# Patient Record
Sex: Female | Born: 1975 | Race: Black or African American | Hispanic: No | Marital: Single | Smoking: Never smoker
Health system: Southern US, Community
[De-identification: ages and names within clinical notes are randomized; demographics above are authoritative.]

## PROBLEM LIST (undated history)

## (undated) DIAGNOSIS — K219 Gastro-esophageal reflux disease without esophagitis: Secondary | ICD-10-CM

---

## 2000-06-03 ENCOUNTER — Emergency Department (HOSPITAL_COMMUNITY): Admission: EM | Admit: 2000-06-03 | Discharge: 2000-06-03 | Payer: Self-pay | Admitting: Emergency Medicine

## 2001-12-26 ENCOUNTER — Other Ambulatory Visit: Admission: RE | Admit: 2001-12-26 | Discharge: 2001-12-26 | Payer: Self-pay | Admitting: Obstetrics and Gynecology

## 2003-04-04 ENCOUNTER — Other Ambulatory Visit: Admission: RE | Admit: 2003-04-04 | Discharge: 2003-04-04 | Payer: Self-pay | Admitting: Obstetrics and Gynecology

## 2007-07-26 ENCOUNTER — Emergency Department (HOSPITAL_COMMUNITY): Admission: EM | Admit: 2007-07-26 | Discharge: 2007-07-26 | Payer: Self-pay | Admitting: Emergency Medicine

## 2007-09-09 ENCOUNTER — Inpatient Hospital Stay (HOSPITAL_COMMUNITY): Admission: AD | Admit: 2007-09-09 | Discharge: 2007-09-09 | Payer: Self-pay | Admitting: Gynecology

## 2011-06-23 LAB — CULTURE, ROUTINE-ABSCESS

## 2011-09-28 ENCOUNTER — Encounter (HOSPITAL_COMMUNITY): Payer: Self-pay | Admitting: Emergency Medicine

## 2011-09-28 ENCOUNTER — Emergency Department (HOSPITAL_COMMUNITY)
Admission: EM | Admit: 2011-09-28 | Discharge: 2011-09-28 | Disposition: A | Discharge status: Home / Self Care | Payer: Self-pay | Attending: Emergency Medicine | Admitting: Emergency Medicine

## 2011-09-28 DIAGNOSIS — M549 Dorsalgia, unspecified: Secondary | ICD-10-CM | POA: Insufficient documentation

## 2011-09-28 DIAGNOSIS — N390 Urinary tract infection, site not specified: Secondary | ICD-10-CM | POA: Insufficient documentation

## 2011-09-28 DIAGNOSIS — R109 Unspecified abdominal pain: Secondary | ICD-10-CM | POA: Insufficient documentation

## 2011-09-28 LAB — URINALYSIS, ROUTINE W REFLEX MICROSCOPIC
Protein, ur: NEGATIVE mg/dL
Urobilinogen, UA: 1 mg/dL (ref 0.0–1.0)

## 2011-09-28 LAB — URINE MICROSCOPIC-ADD ON

## 2011-09-28 MED ORDER — CIPROFLOXACIN HCL 500 MG PO TABS: 500.0000 mg | ORAL_TABLET | Freq: Two times a day (BID) | ORAL | Status: AC

## 2011-09-28 NOTE — ED Provider Notes (Signed)
History     CSN: [id number]  Arrival date & time 09/28/11  1745   First MD Initiated Contact with Patient 09/28/11 1830      Chief Complaint  Patient presents with  . Back Pain    (Consider location/radiation/quality/duration/timing/severity/associated sxs/prior treatment) Patient is a 36 y.o. female presenting with back pain. The history is provided by the patient. No language interpreter was used.  Back Pain  This is a recurrent problem. The current episode started 12 to 24 hours ago. Episode frequency: intermittant L flank pain. The problem has not changed since onset.The pain is associated with no known injury. The quality of the pain is described as stabbing and aching. The pain does not radiate. The pain is at a severity of 10/10. Pertinent negatives include no chest pain, no fever, no abdominal pain, no dysuria, no pelvic pain, no paresthesias and no paresis.  Reports intermittant right flank pain that is severe and lasts up to an hour at a time. States that she has to pace back and forth or ball up when the pain comes on. 4 episodes in the last 24 hours.   Denies hematuria or pmh of kidney stone. Pain free now. Does not want CT scan because she has no insurance.    History reviewed. No pertinent past medical history.  History reviewed. No pertinent past surgical history.  History reviewed. No pertinent family history.  History  Substance Use Topics  . Smoking status: Never Smoker   . Smokeless tobacco: Not on file  . Alcohol Use: Yes    OB History    Grav Para Term Preterm Abortions TAB SAB Ect Mult Living                  Review of Systems  Constitutional: Negative for fever.  Cardiovascular: Negative for chest pain.  Gastrointestinal: Negative for abdominal pain.  Genitourinary: Positive for flank pain. Negative for dysuria and pelvic pain.  Musculoskeletal: Positive for back pain.  Neurological: Negative for paresthesias.  All other systems reviewed and are  negative.    Allergies  Review of patient's allergies indicates no known allergies.  Home Medications   Current Outpatient Rx  Name Route Sig Dispense Refill  . IBUPROFEN 200 MG PO TABS Oral Take 200 mg by mouth every 6 (six) hours as needed. For pain    . SIMETHICONE 80 MG PO CHEW Oral Chew 80 mg by mouth every 6 (six) hours as needed. For gas relief      BP 134/84  Pulse 79  Temp(Src) 98.4 F (36.9 C) (Oral)  Resp 18  SpO2 100%  LMP 09/14/2011  Physical Exam  Nursing note and vitals reviewed. Constitutional: She is oriented to person, place, and time. She appears well-developed and well-nourished.  HENT:  Head: Normocephalic.  Eyes: Pupils are equal, round, and reactive to light.  Neck: Normal range of motion.  Cardiovascular: Normal rate.   Pulmonary/Chest: Effort normal.  Abdominal: Soft. She exhibits no distension. There is no tenderness. There is no rebound and no guarding.  Musculoskeletal: Normal range of motion.  Neurological: She is alert and oriented to person, place, and time.  Skin: Skin is warm and dry.  Psychiatric: She has a normal mood and affect.    ED Course  Procedures (including critical care time)  Labs Reviewed - No data to display No results found.   No diagnosis found.    MDM  Intermittant R flank pain x 24 hours lasting about an hour at  a time. Pain free in ER.  Refused CT to r/o kidney stone.  Will strain her urine. And return if worse.  Treated for UTI with blood and wbc in her urine.  Suspect she may have passed a kidney stone although there is no pmh.   Labs Reviewed  URINALYSIS, ROUTINE W REFLEX MICROSCOPIC - Abnormal; Notable for the following:    APPearance CLOUDY (*)    Hgb urine dipstick TRACE (*)    Ketones, ur TRACE (*)    Leukocytes, UA MODERATE (*)    All other components within normal limits  URINE MICROSCOPIC-ADD ON - Abnormal; Notable for the following:    Squamous Epithelial / LPF MANY (*)    Bacteria, UA FEW  (*)    All other components within normal limits  LAB REPORT - SCANNED  URINE CULTURE          Jethro Bastos, NP 09/29/11 2211

## 2011-09-30 NOTE — ED Provider Notes (Signed)
Medical screening examination/treatment/procedure(s) were performed by non-physician practitioner and as supervising physician I was immediately available for consultation/collaboration.  Samanth Mirkin, MD 09/30/11 1406 

## 2015-02-02 ENCOUNTER — Emergency Department (HOSPITAL_COMMUNITY)
Admission: EM | Admit: 2015-02-02 | Discharge: 2015-02-02 | Disposition: A | Discharge status: Home / Self Care | Payer: BLUE CROSS/BLUE SHIELD | Attending: Emergency Medicine | Admitting: Emergency Medicine

## 2015-02-02 ENCOUNTER — Other Ambulatory Visit: Payer: Self-pay

## 2015-02-02 ENCOUNTER — Emergency Department (HOSPITAL_COMMUNITY): Payer: BLUE CROSS/BLUE SHIELD

## 2015-02-02 ENCOUNTER — Encounter (HOSPITAL_COMMUNITY): Payer: Self-pay | Admitting: Emergency Medicine

## 2015-02-02 DIAGNOSIS — K219 Gastro-esophageal reflux disease without esophagitis: Secondary | ICD-10-CM

## 2015-02-02 DIAGNOSIS — M546 Pain in thoracic spine: Secondary | ICD-10-CM | POA: Insufficient documentation

## 2015-02-02 DIAGNOSIS — Z791 Long term (current) use of non-steroidal anti-inflammatories (NSAID): Secondary | ICD-10-CM | POA: Insufficient documentation

## 2015-02-02 DIAGNOSIS — Z79899 Other long term (current) drug therapy: Secondary | ICD-10-CM | POA: Insufficient documentation

## 2015-02-02 LAB — I-STAT TROPONIN, ED: Troponin i, poc: 0 ng/mL (ref 0.00–0.08)

## 2015-02-02 MED ORDER — GI COCKTAIL ~~LOC~~
30.0000 mL | Freq: Once | ORAL | Status: AC
  Administered 2015-02-02: 30 mL via ORAL
  Filled 2015-02-02: qty 30

## 2015-02-02 MED ORDER — OMEPRAZOLE 20 MG PO CPDR: 20.0000 mg | DELAYED_RELEASE_CAPSULE | Freq: Every day | ORAL | Status: DC

## 2015-02-02 NOTE — ED Notes (Signed)
Pt ambulating independently w/ steady gait on d/c in no acute distress, A&Ox4. D/c instructions reviewed w/ pt and family - pt and family deny any further questions or concerns at present. Rx given x1  

## 2015-02-02 NOTE — ED Provider Notes (Signed)
CSN: [id number]     Arrival date & time 02/02/15  4098 History   First MD Initiated Contact with Patient 02/02/15 0221     Chief Complaint  Patient presents with  . Chest Pain  . Back Pain     (Consider location/radiation/quality/duration/timing/severity/associated sxs/prior Treatment) Patient is a 39 y.o. female presenting with back pain. The history is provided by the patient. No language interpreter was used.  Back Pain Worsened by:  Lying down Associated symptoms: no abdominal pain and no fever   Associated symptoms comment:  She complains of sharp pain in the upper back for the past 6 days. No cough, SOB, fever. She reports vomiting she feels is related to when the pain is at its worst. There is no pain during the day, only at night when she lays down to go to bed. She has tried taking GasEx without relief.    History reviewed. No pertinent past medical history. History reviewed. No pertinent past surgical history. No family history on file. History  Substance Use Topics  . Smoking status: Never Smoker   . Smokeless tobacco: Not on file  . Alcohol Use: Yes   OB History    No data available     Review of Systems  Constitutional: Negative for fever and chills.  HENT: Negative.   Respiratory: Negative.  Negative for cough and shortness of breath.   Gastrointestinal: Positive for vomiting. Negative for abdominal pain.  Musculoskeletal: Positive for back pain.  Skin: Negative.   Neurological: Negative.       Allergies  Review of patient's allergies indicates no known allergies.  Home Medications   Prior to Admission medications   Medication Sig Start Date End Date Taking? Authorizing Provider  diphenhydrAMINE (BENADRYL) 25 mg capsule Take 25 mg by mouth once.   Yes Historical Provider, MD  naproxen sodium (ANAPROX) 220 MG tablet Take 220 mg by mouth 2 (two) times daily with a meal.   Yes Historical Provider, MD  simethicone (MYLICON) 80 MG chewable tablet Chew 80  mg by mouth every 6 (six) hours as needed. For gas relief   Yes Historical Provider, MD  ibuprofen (ADVIL,MOTRIN) 200 MG tablet Take 200 mg by mouth every 6 (six) hours as needed. For pain    Historical Provider, MD   BP 158/88 mmHg  Pulse 86  Temp(Src) 98.5 F (36.9 C) (Oral)  Resp 18  Ht  (1.6 m)  Wt 155 lb (70.308 kg)  BMI 27.46 kg/m2  SpO2 100%  LMP 01/23/2015 Physical Exam  Constitutional: She is oriented to person, place, and time. She appears well-developed and well-nourished.  HENT:  Head: Normocephalic.  Neck: Normal range of motion. Neck supple.  Cardiovascular: Normal rate and regular rhythm.   Pulmonary/Chest: Effort normal and breath sounds normal. She has no wheezes. She has no rales.  Abdominal: Soft. Bowel sounds are normal. There is no tenderness. There is no rebound and no guarding.  Musculoskeletal: Normal range of motion.  Neurological: She is alert and oriented to person, place, and time.  Skin: Skin is warm and dry. No rash noted.  Psychiatric: She has a normal mood and affect.    ED Course  Procedures (including critical care time) Labs Review Labs Reviewed  Rosezena Sensor, ED    Imaging Review Dg Chest Port 1 View  02/02/2015   CLINICAL DATA:  Acute onset of central pain and pressure, radiating to the back. Vomiting. Initial encounter.  EXAM: PORTABLE CHEST - 1 VIEW  COMPARISON:  None.  FINDINGS: The lungs are well-aerated and clear. There is no evidence of focal opacification, pleural effusion or pneumothorax.  The cardiomediastinal silhouette is within normal limits. No acute osseous abnormalities are seen.  IMPRESSION: No acute cardiopulmonary process seen.   Electronically Signed   By: Roanna RaiderJeffery  Chang M.D.   On: 02/02/2015 01:28     EKG Interpretation None      MDM   Final diagnoses:  None    1. GERD  Pain is improved with GI cocktail. Pain worse when lying down - c/w symptoms of reflux esophagitis. Negative CXR, nonacute EKG and  negative troponin reassuring as well.     Elpidio AnisShari Merced Brougham, PA-C 02/02/15 2234  Tomasita CrumbleAdeleke Oni, MD 02/03/15 [phone]

## 2015-02-02 NOTE — ED Notes (Signed)
Pt from  Home c/o central chest pain/pressure that radiates through to her back. Pain is worse when laying down.  Vomiting present when pain comes.

## 2015-02-02 NOTE — Discharge Instructions (Signed)
Gastroesophageal Reflux Disease, Adult  Gastroesophageal reflux disease (GERD) happens when acid from your stomach flows up into the esophagus. When acid comes in contact with the esophagus, the acid causes soreness (inflammation) in the esophagus. Over time, GERD may create small holes (ulcers) in the lining of the esophagus.  CAUSES   · Increased body weight. This puts pressure on the stomach, making acid rise from the stomach into the esophagus.  · Smoking. This increases acid production in the stomach.  · Drinking alcohol. This causes decreased pressure in the lower esophageal sphincter (valve or ring of muscle between the esophagus and stomach), allowing acid from the stomach into the esophagus.  · Late evening meals and a full stomach. This increases pressure and acid production in the stomach.  · A malformed lower esophageal sphincter.  Sometimes, no cause is found.  SYMPTOMS   · Burning pain in the lower part of the mid-chest behind the breastbone and in the mid-stomach area. This may occur twice a week or more often.  · Trouble swallowing.  · Sore throat.  · Dry cough.  · Asthma-like symptoms including chest tightness, shortness of breath, or wheezing.  DIAGNOSIS   Your caregiver may be able to diagnose GERD based on your symptoms. In some cases, X-rays and other tests may be done to check for complications or to check the condition of your stomach and esophagus.  TREATMENT   Your caregiver may recommend over-the-counter or prescription medicines to help decrease acid production. Ask your caregiver before starting or adding any new medicines.   HOME CARE INSTRUCTIONS   · Change the factors that you can control. Ask your caregiver for guidance concerning weight loss, quitting smoking, and alcohol consumption.  · Avoid foods and drinks that make your symptoms worse, such as:  ¨ Caffeine or alcoholic drinks.  ¨ Chocolate.  ¨ Peppermint or mint flavorings.  ¨ Garlic and onions.  ¨ Spicy foods.  ¨ Citrus fruits,  such as oranges, lemons, or limes.  ¨ Tomato-based foods such as sauce, chili, salsa, and pizza.  ¨ Fried and fatty foods.  · Avoid lying down for the 3 hours prior to your bedtime or prior to taking a nap.  · Eat small, frequent meals instead of large meals.  · Wear loose-fitting clothing. Do not wear anything tight around your waist that causes pressure on your stomach.  · Raise the head of your bed 6 to 8 inches with wood blocks to help you sleep. Extra pillows will not help.  · Only take over-the-counter or prescription medicines for pain, discomfort, or fever as directed by your caregiver.  · Do not take aspirin, ibuprofen, or other nonsteroidal anti-inflammatory drugs (NSAIDs).  SEEK IMMEDIATE MEDICAL CARE IF:   · You have pain in your arms, neck, jaw, teeth, or back.  · Your pain increases or changes in intensity or duration.  · You develop nausea, vomiting, or sweating (diaphoresis).  · You develop shortness of breath, or you faint.  · Your vomit is green, yellow, black, or looks like coffee grounds or blood.  · Your stool is red, bloody, or black.  These symptoms could be signs of other problems, such as heart disease, gastric bleeding, or esophageal bleeding.  MAKE SURE YOU:   · Understand these instructions.  · Will watch your condition.  · Will get help right away if you are not doing well or get worse.  Document Released: 06/10/2005 Document Revised: 11/23/2011 Document Reviewed: 03/20/2011  ExitCare® Patient   Information ©2015 [employer]. This information is not intended to replace advice given to you by your health care provider. Make sure you discuss any questions you have with your health care provider.  Food Choices for Gastroesophageal Reflux Disease  When you have gastroesophageal reflux disease (GERD), the foods you eat and your eating habits are very important. Choosing the right foods can help ease the discomfort of GERD.  WHAT GENERAL GUIDELINES DO I NEED TO FOLLOW?  · Choose fruits,  vegetables, whole grains, low-fat dairy products, and low-fat meat, fish, and poultry.  · Limit fats such as oils, salad dressings, butter, nuts, and avocado.  · Keep a food diary to identify foods that cause symptoms.  · Avoid foods that cause reflux. These may be different for different people.  · Eat frequent small meals instead of three large meals each day.  · Eat your meals slowly, in a relaxed setting.  · Limit fried foods.  · Cook foods using methods other than frying.  · Avoid drinking alcohol.  · Avoid drinking large amounts of liquids with your meals.  · Avoid bending over or lying down until 2-3 hours after eating.  WHAT FOODS ARE NOT RECOMMENDED?  The following are some foods and drinks that may worsen your symptoms:  Vegetables  Tomatoes. Tomato juice. Tomato and spaghetti sauce. Chili peppers. Onion and garlic. Horseradish.  Fruits  Oranges, grapefruit, and lemon (fruit and juice).  Meats  High-fat meats, fish, and poultry. This includes hot dogs, ribs, ham, sausage, salami, and bacon.  Dairy  Whole milk and chocolate milk. Sour cream. Cream. Butter. Ice cream. Cream cheese.   Beverages  Coffee and tea, with or without caffeine. Carbonated beverages or energy drinks.  Condiments  Hot sauce. Barbecue sauce.   Sweets/Desserts  Chocolate and cocoa. Donuts. Peppermint and spearmint.  Fats and Oils  High-fat foods, including French fries and potato chips.  Other  Vinegar. Strong spices, such as black pepper, white pepper, red pepper, cayenne, curry powder, cloves, ginger, and chili powder.  The items listed above may not be a complete list of foods and beverages to avoid. Contact your dietitian for more information.  Document Released: 08/31/2005 Document Revised: 09/05/2013 Document Reviewed: 07/05/2013  ExitCare® Patient Information ©2015 [employer]. This information is not intended to replace advice given to you by your health care provider. Make sure you discuss any questions you have with your  health care provider.

## 2016-10-09 ENCOUNTER — Other Ambulatory Visit: Payer: Self-pay | Admitting: Obstetrics and Gynecology

## 2016-10-09 DIAGNOSIS — R928 Other abnormal and inconclusive findings on diagnostic imaging of breast: Secondary | ICD-10-CM

## 2016-11-02 ENCOUNTER — Ambulatory Visit (INDEPENDENT_AMBULATORY_CARE_PROVIDER_SITE_OTHER): Payer: BLUE CROSS/BLUE SHIELD | Admitting: Internal Medicine

## 2016-11-02 ENCOUNTER — Encounter: Payer: Self-pay | Admitting: Internal Medicine

## 2016-11-02 VITALS — BP 128/80 | HR 84 | Temp 98.1°F | Ht 62.25 in | Wt 154.0 lb

## 2016-11-02 DIAGNOSIS — Z0000 Encounter for general adult medical examination without abnormal findings: Secondary | ICD-10-CM

## 2016-11-02 DIAGNOSIS — R5383 Other fatigue: Secondary | ICD-10-CM

## 2016-11-02 LAB — POCT URINALYSIS DIPSTICK
BILIRUBIN UA: NEGATIVE
GLUCOSE UA: NEGATIVE
Ketones, UA: NEGATIVE
LEUKOCYTES UA: NEGATIVE
NITRITE UA: POSITIVE
Protein, UA: NEGATIVE
RBC UA: NEGATIVE
Spec Grav, UA: 1.015
UROBILINOGEN UA: NEGATIVE
pH, UA: 7.5

## 2016-11-02 LAB — TSH: TSH: 2.39 m[IU]/L

## 2016-11-02 NOTE — Progress Notes (Signed)
Subjective:    Patient ID: Theresa Velasquez, female    DOB: 09-Dec-1975, 41 y.o.   MRN: [id number]  HPI  41 year old Female presents to the office for the first time today. Patient called a few weeks ago and indicated she was trying to donate a kidney to her mother and was undergoing testing [facility]. We agreed to accept her as a new patient and she is here today for evaluation.  Says she was turned down at [facility] for kidney donation because of a 24-hour ambulatory blood pressure monitor that showed her blood pressure to be elevated. She indicated that the cuff was uncomfortable and aggravating and she's not sure it was reliable.  She has no history of serious illnesses accidents or operations. No known drug allergies. 2 pregnancies and one miscarriage. No chronic medications. Needs tetanus update.  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 She completed 2 years of college and has gone back to school to finish her degree at [employer]. She is single. Does not smoke. Social alcohol consumption. She has a daughter age 42 and a son age 42 both are healthy. She lives with her son.  Family history: One half sister who is healthy. 2 twin half-brothers and one half-brother all of whom are healthy. Father was murdered when patient was a small child. She doesn't know anything about his family history or his medical history. Mother age 42 has been on dialysis for 14 months with long-standing history of hypertension. Paternal grandmother also had to be on dialysis.  [facility] was within normal limits with left axis deviation. Sinus rhythm ventricular rate 71 with normal intervals. GFR study was normal. CT renal donor protocol indicated abdominal organs were normal. 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 Kidneys were normal bilaterally. Chest x-ray was normal. Urine specimen was normal.  Her 24-hour blood pressure monitor [facility] showed a mean awake blood  pressure of 145/95 with a heart rate of 84. Mean sleep blood pressure was 128/79. Results indicated she had hypertension while awake and borderline hypertension while asleep.      Review of Systems  Constitutional: Negative.   All other systems reviewed and are negative.       Objective:   Physical Exam  Constitutional: She is oriented to person, place, and time. She appears well-developed and well-nourished. No distress.  HENT:  Head: Normocephalic and atraumatic.  Right Ear: External ear normal.  Left Ear: External ear normal.  Mouth/Throat: Oropharynx is clear and moist.  Eyes: Conjunctivae and EOM are normal. Pupils are equal, round, and reactive to light.  Neck: Neck supple. No JVD present. No thyromegaly present.  Cardiovascular: Normal rate, regular rhythm, normal heart sounds and intact distal pulses.   Pulmonary/Chest: Effort normal and breath sounds normal. No respiratory distress. She has no wheezes. She has no rales. She exhibits no tenderness.  Breasts normal female  Abdominal: She exhibits no distension and no mass. There is no tenderness. There is no rebound and no guarding.  Genitourinary:  Genitourinary Comments: Pap deferred. Had Pap smear and of January at [facility]  Musculoskeletal: She exhibits no edema.  Lymphadenopathy:    She has no cervical adenopathy.  Neurological: She is alert and oriented to person, place, and time. She has normal reflexes. No cranial nerve deficit.  Skin: No rash noted. She is not diaphoretic.  Psychiatric: She has a normal mood and affect. Her behavior is normal.  Judgment and thought content normal.  Vitals reviewed.         Assessment & Plan:  She may well have borderline hypertension. She's going to purchase a home blood pressure monitoring return in couple of weeks with some readings. We'll decide if we need to treat her based on that assessment.

## 2016-11-08 NOTE — Patient Instructions (Signed)
It was a pleasure to see you today. Please purchase home blood pressure monitor and keep record of blood pressures over the next couple of weeks at various times of the day.

## 2016-11-17 ENCOUNTER — Ambulatory Visit (INDEPENDENT_AMBULATORY_CARE_PROVIDER_SITE_OTHER): Payer: BLUE CROSS/BLUE SHIELD | Admitting: Internal Medicine

## 2016-11-17 ENCOUNTER — Encounter: Payer: Self-pay | Admitting: Internal Medicine

## 2016-11-17 VITALS — BP 120/88 | HR 75 | Temp 97.7°F | Ht 62.0 in | Wt 155.5 lb

## 2016-11-17 DIAGNOSIS — R030 Elevated blood-pressure reading, without diagnosis of hypertension: Secondary | ICD-10-CM

## 2016-11-17 DIAGNOSIS — Z23 Encounter for immunization: Secondary | ICD-10-CM | POA: Diagnosis not present

## 2016-11-17 NOTE — Progress Notes (Signed)
   Subjective:    Patient ID: Theresa Velasquez, female    DOB: [birth year], 41 y.o.   MRN: [id number]  HPI 41 year old Black Female presented to the office for the first time 11/02/2016. At that time her blood pressure was 128/80. She had had dynamic toward blood pressure monitor at do because she wanted to donate a kidney to her mother. M Lipitor blood pressure monitor showed that her blood pressure was elevated and she was not considered for donation.  Patient says she moved recently and has not been checking her blood pressures. Her blood pressure today is 120/88. I would like to see more readings.  She was given a Tdap Vaccine today.    Review of Systems     Objective:   Physical Exam Not examined. She is asking about thyroid functions. These were normal at last visit and she was given copy of those results.       Assessment & Plan:  Borderline hypertension  Plan: Patient is to keep an eye on her blood pressure and call me with blood pressure readings in the next 2 weeks. Tdap vaccine given.

## 2016-11-17 NOTE — Patient Instructions (Signed)
Call with blood pressure readings in 2 weeks. Tetanus immunization given today.

## 2016-12-09 ENCOUNTER — Ambulatory Visit
Admission: RE | Admit: 2016-12-09 | Discharge: 2016-12-09 | Disposition: A | Discharge status: Home / Self Care | Payer: BLUE CROSS/BLUE SHIELD | Source: Ambulatory Visit | Attending: Obstetrics and Gynecology | Admitting: Obstetrics and Gynecology

## 2016-12-09 ENCOUNTER — Other Ambulatory Visit: Payer: Self-pay | Admitting: Obstetrics and Gynecology

## 2016-12-09 DIAGNOSIS — R928 Other abnormal and inconclusive findings on diagnostic imaging of breast: Secondary | ICD-10-CM

## 2017-01-26 ENCOUNTER — Ambulatory Visit (INDEPENDENT_AMBULATORY_CARE_PROVIDER_SITE_OTHER): Payer: BLUE CROSS/BLUE SHIELD | Admitting: Internal Medicine

## 2017-01-26 ENCOUNTER — Encounter: Payer: Self-pay | Admitting: Internal Medicine

## 2017-01-26 DIAGNOSIS — Z841 Family history of disorders of kidney and ureter: Secondary | ICD-10-CM | POA: Diagnosis not present

## 2017-01-26 DIAGNOSIS — R0989 Other specified symptoms and signs involving the circulatory and respiratory systems: Secondary | ICD-10-CM

## 2017-01-26 MED ORDER — TRIAMTERENE-HCTZ 37.5-25 MG PO TABS: ORAL_TABLET | ORAL | 3 refills | Status: DC

## 2017-02-09 NOTE — Progress Notes (Signed)
   Subjective:    Patient ID: Erroll LunaConsquela D Battaglia, female    DOB: June 27, 1976, 41 y.o.   MRN: [id number]  HPI 41 year old Black Female concerned about persistent elevated blood pressure readings she has been obtaining. Her mother has end-stage kidney disease. Patient was turned down  as a donor due to elevated blood pressure readings on and blood toward blood pressure monitor [facility]. These were mild elevations at the time.  Patient was to be proactive and avoid developing chronic kidney disease as a result of elevated blood pressure.    Review of Systems see above     Objective:   Physical Exam  Blood pressure today is excellent 110/86. On initial visit here in February blood pressure was 128/80. She says she's been getting readings in the 1:30 to 140 range. She's been somewhat dizzy and lightheaded at times. Neck is supple without JVD thyromegaly or carotid bruits. Chest clear. Cardiac exam regular rate and rhythm normal S1 and S2. No lower extremity edema      Assessment & Plan:  Labile blood pressure  Plan: Trial of Maxide 25 one by mouth daily with follow-up June 15. Patient is to keep blood pressure readings at home.

## 2017-02-10 DIAGNOSIS — Z841 Family history of disorders of kidney and ureter: Secondary | ICD-10-CM | POA: Insufficient documentation

## 2017-02-10 DIAGNOSIS — R0989 Other specified symptoms and signs involving the circulatory and respiratory systems: Secondary | ICD-10-CM | POA: Insufficient documentation

## 2017-02-10 NOTE — Patient Instructions (Signed)
Begin Maxide 25 daily and follow-up mid June

## 2017-02-25 ENCOUNTER — Telehealth: Payer: Self-pay | Admitting: Internal Medicine

## 2017-02-25 NOTE — Telephone Encounter (Signed)
Patient scheduled for 1 month follow up appointment for BP check on Friday, 6/15 at 12:00 noon.  Patient called today and cancelled appointment.  When asked if she wanted to reschedule, she said she would call back and reschedule at another time.  She was also scheduled for lab (BMET) at this visit.  She was started on medication at her previous appointment.  This was a follow up on that medication.    FYI

## 2017-02-26 ENCOUNTER — Ambulatory Visit: Payer: BLUE CROSS/BLUE SHIELD | Admitting: Internal Medicine

## 2017-03-12 ENCOUNTER — Telehealth: Payer: Self-pay | Admitting: Internal Medicine

## 2017-03-12 MED ORDER — TRIAMTERENE-HCTZ 37.5-25 MG PO TABS: ORAL_TABLET | ORAL | 3 refills | Status: DC

## 2017-03-12 NOTE — Telephone Encounter (Signed)
Patient calling; states that she was given a paper Rx for her BP medicine for Maxide.  She "lost" the Rx and is calling today and asking if we can send the prescription to her pharmacy.  She was to f/u on 6/15 on her BP and have a BMET drawn.  She cancelled that appointment.  When asked about that during the call, she said she hadn't started taking the medicine because she had lost the Rx, so she cancelled the appointment.    So, she wants to know if you will call this Rx into the pharmacy for her so she can start taking the medication.  This is from 01/26/17 visit that she has not been taking the BP medication.    Pharmacy:  [facility] @ [address]  Best # to reach patient:  [phone]

## 2017-03-12 NOTE — Telephone Encounter (Signed)
Call in Maxide 25 one po daily #30 and follow up in 3 weeks

## 2017-03-12 NOTE — Telephone Encounter (Signed)
Pt made for 04/02/17 and she is picking up RX today

## 2017-03-12 NOTE — Telephone Encounter (Signed)
I have put the patient into the book.

## 2017-04-02 ENCOUNTER — Encounter: Payer: Self-pay | Admitting: Internal Medicine

## 2017-04-02 ENCOUNTER — Ambulatory Visit (INDEPENDENT_AMBULATORY_CARE_PROVIDER_SITE_OTHER): Payer: Self-pay | Admitting: Internal Medicine

## 2017-04-02 VITALS — BP 102/80 | HR 93 | Temp 98.0°F | Ht 62.0 in | Wt 156.0 lb

## 2017-04-02 DIAGNOSIS — Z79899 Other long term (current) drug therapy: Secondary | ICD-10-CM

## 2017-04-02 DIAGNOSIS — I10 Essential (primary) hypertension: Secondary | ICD-10-CM

## 2017-04-02 LAB — POTASSIUM: POTASSIUM: 4 mmol/L (ref 3.5–5.3)

## 2017-04-02 NOTE — Patient Instructions (Addendum)
Continue to monitor blood pressure at home. Continue Maxide 25 one half tablet daily. Physical exam  due February 2019

## 2017-04-02 NOTE — Progress Notes (Signed)
   Subjective:    Patient ID: Theresa Velasquez, female    DOB: 1976/03/09, 41 y.o.   MRN: [id number]  HPI 41 year old Black Female in today for follow-up on elevated blood pressure. At last visit she was given Maxide 25 and currently is taking one half tablet daily. Initially had some nausea when taking it on an empty stomach but now taking it after she eats. No side effects. Her blood pressure today is excellent. She's been monitoring it at home and says it's very stable. At one point it was 90 diastolically but she had not taken her medication.  Her mother has chronic kidney disease and is under the care of Dr. Hyman HopesWebb.  Patient was hoping to give her mother kidney transplant but determinedshe was at risk for hypertension after 24-hour  blood pressure monitor was done. Results from that study of been reviewed previously.  Patient currently has no health insurance. 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    Review of Systems     Objective:   Physical Exam Blood pressure today is excellent at 102/80. Chest clear. Cardiac exam regular rate and rhythm normal S1 and S2. Extremities without edema       Assessment & Plan:  Essential hypertension-stable on Maxide 25 one half tablet daily.  Plan: Serum potassium checked today. She'll continue to monitor blood pressure at home. Her physical exam is due in February 2019.

## 2017-06-01 ENCOUNTER — Other Ambulatory Visit: Payer: Self-pay | Admitting: Obstetrics and Gynecology

## 2017-06-01 DIAGNOSIS — R921 Mammographic calcification found on diagnostic imaging of breast: Secondary | ICD-10-CM

## 2017-06-14 ENCOUNTER — Ambulatory Visit
Admission: RE | Admit: 2017-06-14 | Discharge: 2017-06-14 | Disposition: A | Discharge status: Home / Self Care | Payer: No Typology Code available for payment source | Source: Ambulatory Visit | Attending: Obstetrics and Gynecology | Admitting: Obstetrics and Gynecology

## 2017-06-14 DIAGNOSIS — R921 Mammographic calcification found on diagnostic imaging of breast: Secondary | ICD-10-CM

## 2017-11-16 ENCOUNTER — Other Ambulatory Visit: Payer: Self-pay | Admitting: Obstetrics and Gynecology

## 2017-11-16 DIAGNOSIS — R921 Mammographic calcification found on diagnostic imaging of breast: Secondary | ICD-10-CM

## 2017-12-09 ENCOUNTER — Other Ambulatory Visit: Payer: Self-pay | Admitting: Oncology

## 2017-12-10 ENCOUNTER — Ambulatory Visit
Admission: RE | Admit: 2017-12-10 | Discharge: 2017-12-10 | Disposition: A | Discharge status: Home / Self Care | Payer: BLUE CROSS/BLUE SHIELD | Source: Ambulatory Visit | Attending: Obstetrics and Gynecology | Admitting: Obstetrics and Gynecology

## 2017-12-10 DIAGNOSIS — R921 Mammographic calcification found on diagnostic imaging of breast: Secondary | ICD-10-CM

## 2018-01-16 ENCOUNTER — Other Ambulatory Visit: Payer: Self-pay | Admitting: Internal Medicine

## 2018-01-16 NOTE — Telephone Encounter (Signed)
Please call her. She is past due for CPE was due February.

## 2018-01-19 NOTE — Telephone Encounter (Signed)
Scheduled labs with patient and CPE.

## 2018-02-28 ENCOUNTER — Other Ambulatory Visit: Payer: Self-pay | Admitting: Internal Medicine

## 2018-02-28 DIAGNOSIS — Z1329 Encounter for screening for other suspected endocrine disorder: Secondary | ICD-10-CM

## 2018-02-28 DIAGNOSIS — Z0000 Encounter for general adult medical examination without abnormal findings: Secondary | ICD-10-CM

## 2018-02-28 DIAGNOSIS — I10 Essential (primary) hypertension: Secondary | ICD-10-CM

## 2018-02-28 DIAGNOSIS — Z1321 Encounter for screening for nutritional disorder: Secondary | ICD-10-CM

## 2018-02-28 DIAGNOSIS — Z13220 Encounter for screening for lipoid disorders: Secondary | ICD-10-CM

## 2018-02-28 DIAGNOSIS — Z841 Family history of disorders of kidney and ureter: Secondary | ICD-10-CM

## 2018-03-07 ENCOUNTER — Other Ambulatory Visit: Payer: BLUE CROSS/BLUE SHIELD | Admitting: Internal Medicine

## 2018-03-07 DIAGNOSIS — Z841 Family history of disorders of kidney and ureter: Secondary | ICD-10-CM

## 2018-03-07 DIAGNOSIS — Z13220 Encounter for screening for lipoid disorders: Secondary | ICD-10-CM

## 2018-03-07 DIAGNOSIS — Z1329 Encounter for screening for other suspected endocrine disorder: Secondary | ICD-10-CM

## 2018-03-07 DIAGNOSIS — Z0000 Encounter for general adult medical examination without abnormal findings: Secondary | ICD-10-CM

## 2018-03-07 DIAGNOSIS — Z1321 Encounter for screening for nutritional disorder: Secondary | ICD-10-CM

## 2018-03-07 DIAGNOSIS — I10 Essential (primary) hypertension: Secondary | ICD-10-CM

## 2018-03-08 ENCOUNTER — Ambulatory Visit (INDEPENDENT_AMBULATORY_CARE_PROVIDER_SITE_OTHER): Payer: BLUE CROSS/BLUE SHIELD | Admitting: Internal Medicine

## 2018-03-08 ENCOUNTER — Encounter: Payer: Self-pay | Admitting: Internal Medicine

## 2018-03-08 VITALS — BP 110/70 | HR 78 | Ht 62.0 in | Wt 167.0 lb

## 2018-03-08 DIAGNOSIS — Z13220 Encounter for screening for lipoid disorders: Secondary | ICD-10-CM

## 2018-03-08 DIAGNOSIS — Z1321 Encounter for screening for nutritional disorder: Secondary | ICD-10-CM | POA: Diagnosis not present

## 2018-03-08 DIAGNOSIS — I10 Essential (primary) hypertension: Secondary | ICD-10-CM

## 2018-03-08 DIAGNOSIS — Z841 Family history of disorders of kidney and ureter: Secondary | ICD-10-CM | POA: Diagnosis not present

## 2018-03-08 DIAGNOSIS — Z0000 Encounter for general adult medical examination without abnormal findings: Secondary | ICD-10-CM | POA: Diagnosis not present

## 2018-03-08 DIAGNOSIS — Z1329 Encounter for screening for other suspected endocrine disorder: Secondary | ICD-10-CM | POA: Diagnosis not present

## 2018-03-08 DIAGNOSIS — Z7189 Other specified counseling: Secondary | ICD-10-CM

## 2018-03-08 DIAGNOSIS — Z7184 Encounter for health counseling related to travel: Secondary | ICD-10-CM

## 2018-03-08 MED ORDER — TRIAMTERENE-HCTZ 37.5-25 MG PO TABS: ORAL_TABLET | ORAL | 5 refills | Status: DC

## 2018-03-08 MED ORDER — CIPROFLOXACIN HCL 500 MG PO TABS: 500.0000 mg | ORAL_TABLET | Freq: Two times a day (BID) | ORAL | 0 refills | Status: DC

## 2018-03-08 MED ORDER — ONDANSETRON HCL 4 MG PO TABS: 4.0000 mg | ORAL_TABLET | Freq: Three times a day (TID) | ORAL | 0 refills | Status: DC | PRN

## 2018-03-08 NOTE — Progress Notes (Signed)
Subjective:    Patient ID: Theresa Velasquez, female    DOB: 14-Sep-1976, 42 y.o.   MRN: [id number]  HPI 42 year old Black Female for health maintenance exam and evaluation of medical issues.  [facility] within past year and has been told she has lumbar disc disease at L5.Apparently had steroid injection with some improvement.  However unable to go to gym and exercise.  Advised walking.  Does not like to walk on treadmill- says it is boring  GYN care done at [facility] .  Planning to go on cruise next week.  [place] be going to [place].  Have given her Cipro 500 mg tablets twice daily for 10 days and Zofran 4 mg tablets to take with her on her trip in case of illness.  She has a history of essential hypertension treated with Maxide 25.  Blood pressure is normal today.  She first presented to the office in February 2018 indicating she was trying to donate a kidney to her mother who had chronic kidney disease.  She underwent testing at [facility] but apparently was told that her blood pressure was elevated on 24-hour ambulatory blood pressure monitor.  She was turned down for kidney transplant.  Records reveal her 24-hour blood pressure monitor at [facility] showed a mean a white blood pressure of 145/95 with a heart rate of 84.  Mean sleep blood pressure was 128/79.  Results indicated she had hypertension while awake and borderline hypertension while asleep.  EKG done at [facility] was within normal limits with  left axis deviation.  CT renal donor protocol indicated abdominal organs were normal but she had a multi-fibroid uterus.  Kidneys were normal bilaterally.  Chest x-ray was normal.  Urine specimen was normal.  Family history: One half sister is healthy.  2 twin half-brothers and one half brother all of whom are healthy.  Father was murdered when patient was a small child.  She does not know anything about his family history or his medical history.  Mother  in her early 4060s is been on dialysis for about 2 years with long-standing history of hypertension.  Paternal grandmother also had to be on dialysis.  Mother is still awaiting kidney donor but stable.  Patient has no history of serious illnesses accidents or operations.  No known drug allergies.  2 pregnancies and one miscarriage.  No chronic medications.  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  She completed 2 years of college.  She is single.  Does not smoke.  Social alcohol consumption.  She has a daughter and a son both in their 4320s both healthy.  She lives with her son.      Review of Systems other than back issue-no new complaints     Objective:   Physical Exam  Constitutional: She is oriented to person, place, and time. She appears well-developed and well-nourished. No distress.  HENT:  Head: Normocephalic and atraumatic.  Right Ear: External ear normal.  Left Ear: External ear normal.  Mouth/Throat: Oropharynx is clear and moist.  Eyes: Pupils are equal, round, and reactive to light. Conjunctivae are normal. Right eye exhibits no discharge. Left eye exhibits no discharge.  Neck: Neck supple. No JVD present. No tracheal deviation present. No thyromegaly present.  Cardiovascular: Normal rate, regular rhythm, normal heart sounds and intact distal pulses.  No murmur heard. Pulmonary/Chest: No stridor. No respiratory distress. She has no wheezes. She has no rales.  Abdominal: Soft. Bowel sounds are normal. She exhibits no distension and no mass. There is no tenderness. There is no rebound and no guarding. No hernia.  Genitourinary:  Genitourinary Comments: Deferred to [facility]  Musculoskeletal: She exhibits no edema.  Lymphadenopathy:    She has no cervical adenopathy.  Neurological: She is alert and oriented to person, place, and time. She displays normal reflexes. No cranial nerve deficit or sensory deficit. Coordination normal.  Skin: She is not  diaphoretic.  Psychiatric: She has a normal mood and affect. Her behavior is normal. Judgment and thought content normal.  Vitals reviewed.         Assessment & Plan:  Impression:  Travel advice encounter-prescriptions for Cipro and Zofran as she is going on  cruise next week to [place]   Essential hypertension stable on Maxide 25  BMI-30.54.  Unable to do heavy exercise due to lumbar disc disease.  Recommend walking.  Plan: Patient advised to continue monitoring her blood pressure at home and return in 1 year or as needed.  Fasting labs drawn and are pending today.

## 2018-03-08 NOTE — Addendum Note (Signed)
Addended by: Gregery NaVALENCIA, Sharnell Knight P on: 03/08/2018 11:06 AM   Modules accepted: Orders

## 2018-03-08 NOTE — Patient Instructions (Signed)
It was a pleasure to see you today.  Labs are pending.  Return in 1 year or as needed.  Continue to monitor your blood pressure at home and call if it is elevated.

## 2018-03-09 ENCOUNTER — Other Ambulatory Visit: Payer: Self-pay | Admitting: Internal Medicine

## 2018-03-09 DIAGNOSIS — R718 Other abnormality of red blood cells: Secondary | ICD-10-CM

## 2018-03-09 LAB — CBC WITH DIFFERENTIAL/PLATELET
BASOS ABS: 61 {cells}/uL (ref 0–200)
Basophils Relative: 0.9 %
Eosinophils Absolute: 231 cells/uL (ref 15–500)
Eosinophils Relative: 3.4 %
HEMATOCRIT: 34.7 % — AB (ref 35.0–45.0)
Hemoglobin: 11.7 g/dL (ref 11.7–15.5)
LYMPHS ABS: 1693 {cells}/uL (ref 850–3900)
MCH: 26.9 pg — AB (ref 27.0–33.0)
MCHC: 33.7 g/dL (ref 32.0–36.0)
MCV: 79.8 fL — AB (ref 80.0–100.0)
MPV: 10.6 fL (ref 7.5–12.5)
Monocytes Relative: 8.3 %
NEUTROS PCT: 62.5 %
Neutro Abs: 4250 cells/uL (ref 1500–7800)
Platelets: 382 10*3/uL (ref 140–400)
RBC: 4.35 10*6/uL (ref 3.80–5.10)
RDW: 15.5 % — AB (ref 11.0–15.0)
Total Lymphocyte: 24.9 %
WBC mixed population: 564 cells/uL (ref 200–950)
WBC: 6.8 10*3/uL (ref 3.8–10.8)

## 2018-03-09 LAB — COMPLETE METABOLIC PANEL WITH GFR
AG Ratio: 1.3 (calc) (ref 1.0–2.5)
ALBUMIN MSPROF: 4.2 g/dL (ref 3.6–5.1)
ALT: 7 U/L (ref 6–29)
AST: 12 U/L (ref 10–30)
Alkaline phosphatase (APISO): 117 U/L — ABNORMAL HIGH (ref 33–115)
BILIRUBIN TOTAL: 0.5 mg/dL (ref 0.2–1.2)
BUN: 11 mg/dL (ref 7–25)
CHLORIDE: 105 mmol/L (ref 98–110)
CO2: 26 mmol/L (ref 20–32)
CREATININE: 0.72 mg/dL (ref 0.50–1.10)
Calcium: 9.3 mg/dL (ref 8.6–10.2)
GFR, EST AFRICAN AMERICAN: 119 mL/min/{1.73_m2} (ref >=60)
GFR, Est Non African American: 103 mL/min/{1.73_m2} (ref >=60)
GLUCOSE: 92 mg/dL (ref 65–99)
Globulin: 3.3 g/dL (calc) (ref 1.9–3.7)
Potassium: 4.4 mmol/L (ref 3.5–5.3)
Sodium: 141 mmol/L (ref 135–146)
TOTAL PROTEIN: 7.5 g/dL (ref 6.1–8.1)

## 2018-03-09 LAB — IRON,TIBC AND FERRITIN PANEL
%SAT: 10 % — AB (ref 16–45)
Ferritin: 15 ng/mL — ABNORMAL LOW (ref 16–232)
IRON: 40 ug/dL (ref 40–190)
TIBC: 385 ug/dL (ref 250–450)

## 2018-03-09 LAB — LIPID PANEL
CHOLESTEROL: 185 mg/dL (ref <200)
HDL: 42 mg/dL — ABNORMAL LOW (ref >50)
LDL Cholesterol (Calc): 121 mg/dL (calc) — ABNORMAL HIGH
Non-HDL Cholesterol (Calc): 143 mg/dL (calc) — ABNORMAL HIGH (ref <130)
TRIGLYCERIDES: 114 mg/dL (ref <150)
Total CHOL/HDL Ratio: 4.4 (calc) (ref <5.0)

## 2018-03-09 LAB — TEST AUTHORIZATION

## 2018-03-09 LAB — TSH: TSH: 2.06 m[IU]/L

## 2018-03-09 LAB — VITAMIN D 25 HYDROXY (VIT D DEFICIENCY, FRACTURES): VIT D 25 HYDROXY: 25 ng/mL — AB (ref 30–100)

## 2018-10-12 ENCOUNTER — Other Ambulatory Visit: Payer: Self-pay | Admitting: Internal Medicine

## 2018-10-12 NOTE — Telephone Encounter (Signed)
CPE due after June 25. I will be away during week of July 4th. Maybe schedule CPE the week after and refill until then. Please call her.

## 2018-10-14 ENCOUNTER — Other Ambulatory Visit: Payer: Self-pay

## 2018-10-14 MED ORDER — TRIAMTERENE-HCTZ 37.5-25 MG PO TABS: ORAL_TABLET | ORAL | 5 refills | Status: DC

## 2019-03-23 ENCOUNTER — Other Ambulatory Visit: Payer: BLUE CROSS/BLUE SHIELD | Admitting: Internal Medicine

## 2019-03-24 ENCOUNTER — Telehealth: Payer: Self-pay | Admitting: Internal Medicine

## 2019-03-24 ENCOUNTER — Encounter: Payer: Self-pay | Admitting: Internal Medicine

## 2019-03-24 NOTE — Telephone Encounter (Signed)
Called to inquire about patient missing CPE appointment, she thought she had canceled in via the automated system, however she had canceled the lab appointment and not the CPE appointment. I ask if she would like to rescheduled and she said not at this time, I let her know we are booking out in to late October to early November. She stated she will call back when the [facility] numbers go down in the [place] area and reschedule.

## 2019-08-17 ENCOUNTER — Other Ambulatory Visit: Payer: Self-pay

## 2019-08-17 MED ORDER — TRIAMTERENE-HCTZ 37.5-25 MG PO TABS
ORAL_TABLET | ORAL | 5 refills | Status: DC
Start: 2019-08-17 — End: 2019-12-22

## 2019-11-11 ENCOUNTER — Ambulatory Visit: Payer: Self-pay | Attending: Internal Medicine

## 2019-11-11 DIAGNOSIS — Z23 Encounter for immunization: Secondary | ICD-10-CM | POA: Insufficient documentation

## 2019-11-11 NOTE — Progress Notes (Signed)
   Covid-19 Vaccination Clinic  Name:  GINETTE BRADWAY    MRN: [id number] DOB: 23-Apr-1976  11/11/2019  Ms. Gubler was observed post Covid-19 immunization for 15 minutes without incidence. She was provided with Vaccine Information Sheet and instruction to access the V-Safe system.   Ms. Taormina was instructed to call 911 with any severe reactions post vaccine: Marland Kitchen Difficulty breathing  . Swelling of your face and throat  . A fast heartbeat  . A bad rash all over your body  . Dizziness and weakness    Immunizations Administered    Name Date Dose VIS Date Route   Pfizer COVID-19 Vaccine 11/11/2019  9:38 AM 0.3 mL 08/25/2019 Intramuscular   Manufacturer: [employer], [employer]   Lot: [id number]   NDC: [address]-7

## 2019-12-02 ENCOUNTER — Ambulatory Visit: Payer: Self-pay | Attending: Internal Medicine

## 2019-12-02 DIAGNOSIS — Z23 Encounter for immunization: Secondary | ICD-10-CM

## 2019-12-02 NOTE — Progress Notes (Signed)
   Covid-19 Vaccination Clinic  Name:  Theresa Velasquez    MRN: [id number] DOB: Jul 04, 1976  12/02/2019  [name] was observed post Covid-19 immunization for 15 minutes without incident. She was provided with Vaccine Information Sheet and instruction to access the V-Safe system.   [name] was instructed to call 911 with any severe reactions post vaccine: Marland Kitchen Difficulty breathing  . Swelling of face and throat  . A fast heartbeat  . A bad rash all over body  . Dizziness and weakness   Immunizations Administered    Name Date Dose VIS Date Route   Pfizer COVID-19 Vaccine 12/02/2019 12:08 PM 0.3 mL 08/25/2019 Intramuscular   Manufacturer: [employer], [employer]   Lot: [id number]   NDC: [address]-8

## 2019-12-06 ENCOUNTER — Ambulatory Visit: Payer: Self-pay

## 2019-12-13 ENCOUNTER — Ambulatory Visit (INDEPENDENT_AMBULATORY_CARE_PROVIDER_SITE_OTHER): Payer: Self-pay

## 2019-12-13 ENCOUNTER — Other Ambulatory Visit: Payer: Self-pay

## 2019-12-13 ENCOUNTER — Ambulatory Visit (HOSPITAL_COMMUNITY)
Admission: EM | Admit: 2019-12-13 | Discharge: 2019-12-13 | Disposition: A | Discharge status: Home / Self Care | Payer: Self-pay | Attending: Family Medicine | Admitting: Family Medicine

## 2019-12-13 ENCOUNTER — Encounter (HOSPITAL_COMMUNITY): Payer: Self-pay

## 2019-12-13 DIAGNOSIS — R1110 Vomiting, unspecified: Secondary | ICD-10-CM

## 2019-12-13 DIAGNOSIS — R112 Nausea with vomiting, unspecified: Secondary | ICD-10-CM | POA: Insufficient documentation

## 2019-12-13 DIAGNOSIS — R1012 Left upper quadrant pain: Secondary | ICD-10-CM | POA: Insufficient documentation

## 2019-12-13 HISTORY — DX: Gastro-esophageal reflux disease without esophagitis: K21.9

## 2019-12-13 LAB — COMPREHENSIVE METABOLIC PANEL
ALT: 137 U/L — ABNORMAL HIGH (ref 0–44)
AST: 99 U/L — ABNORMAL HIGH (ref 15–41)
Albumin: 3.9 g/dL (ref 3.5–5.0)
Alkaline Phosphatase: 180 U/L — ABNORMAL HIGH (ref 38–126)
Anion gap: 10 (ref 5–15)
BUN: 10 mg/dL (ref 6–20)
CO2: 24 mmol/L (ref 22–32)
Calcium: 9.3 mg/dL (ref 8.9–10.3)
Chloride: 106 mmol/L (ref 98–111)
Creatinine, Ser: 0.72 mg/dL (ref 0.44–1.00)
GFR calc Af Amer: >60 mL/min (ref >60)
GFR calc non Af Amer: >60 mL/min (ref >60)
Glucose, Bld: 96 mg/dL (ref 70–99)
Potassium: 3.8 mmol/L (ref 3.5–5.1)
Sodium: 140 mmol/L (ref 135–145)
Total Bilirubin: 1.6 mg/dL — ABNORMAL HIGH (ref 0.3–1.2)
Total Protein: 7.8 g/dL (ref 6.5–8.1)

## 2019-12-13 LAB — CBC WITH DIFFERENTIAL/PLATELET
Abs Immature Granulocytes: 0.02 10*3/uL (ref 0.00–0.07)
Basophils Absolute: 0.1 10*3/uL (ref 0.0–0.1)
Basophils Relative: 1 %
Eosinophils Absolute: 0.4 10*3/uL (ref 0.0–0.5)
Eosinophils Relative: 5 %
HCT: 39.6 % (ref 36.0–46.0)
Hemoglobin: 13.3 g/dL (ref 12.0–15.0)
Immature Granulocytes: 0 %
Lymphocytes Relative: 17 %
Lymphs Abs: 1.3 10*3/uL (ref 0.7–4.0)
MCH: 27.9 pg (ref 26.0–34.0)
MCHC: 33.6 g/dL (ref 30.0–36.0)
MCV: 83 fL (ref 80.0–100.0)
Monocytes Absolute: 0.5 10*3/uL (ref 0.1–1.0)
Monocytes Relative: 6 %
Neutro Abs: 5.4 10*3/uL (ref 1.7–7.7)
Neutrophils Relative %: 71 %
Platelets: 368 10*3/uL (ref 150–400)
RBC: 4.77 MIL/uL (ref 3.87–5.11)
RDW: 14.2 % (ref 11.5–15.5)
WBC: 7.7 10*3/uL (ref 4.0–10.5)
nRBC: 0 % (ref 0.0–0.2)

## 2019-12-13 MED ORDER — DICYCLOMINE HCL 20 MG PO TABS: 20.0000 mg | ORAL_TABLET | Freq: Two times a day (BID) | ORAL | 0 refills | Status: DC

## 2019-12-13 NOTE — ED Triage Notes (Signed)
C/o 10/10 abdominal pain brought on by eating, followed by nausea/vomiting and abdominal distention.

## 2019-12-13 NOTE — Discharge Instructions (Addendum)
Your x-ray today was negative.  I am still suspecting that it could be her gallbladder.  We will go ahead and draw CBC and metabolic panel to check liver and kidneys as well as for infection.  We will call you when we get this back, it should be today but at the latest it will be in the morning.  I have gone ahead and put in a referral for GI.  They will probably call you today.  I would advise you to follow-up with him either way.

## 2019-12-13 NOTE — ED Notes (Signed)
Patient is in xray now, patient cannot be discharged, disposition changed.

## 2019-12-13 NOTE — ED Provider Notes (Signed)
[facility]    CSN: [id number] Arrival date & time: 12/13/19  8588      History   Chief Complaint Chief Complaint  Patient presents with  . Abdominal Pain    HPI Theresa Velasquez is a 44 y.o. female.   Patient reports that she has been experiencing 10/10 abdominal pain about an hour after she eats solid foods.  She also reports nausea and vomiting after she eats.  Patient reports that she can tolerate liquids fine with no pain, no nausea, no vomiting.  She has made no attempt to treat this at home, with the exception of avoiding solid foods over the last 12 hours.  She denies ever having a problem like this before she reports that she still has gallbladder and appendix.  Denies ever seeing GI for this.  Denies headache, cough, shortness of breath, chest tightness, diarrhea, chills, body aches, rash, fever, other symptoms.  Per chart review, patient has medical history significant for hypertension.  The history is provided by the patient.    Past Medical History:  Diagnosis Date  . GERD (gastroesophageal reflux disease)     Patient Active Problem List   Diagnosis Date Noted  . Essential hypertension 04/02/2017  . Family history of chronic kidney disease 02/10/2017    History reviewed. No pertinent surgical history.  OB History   No obstetric history on file.      Home Medications    Prior to Admission medications   Medication Sig Start Date End Date Taking? Authorizing Provider  ciprofloxacin (CIPRO) 500 MG tablet Take 1 tablet (500 mg total) by mouth 2 (two) times daily. 03/08/18   Margaree Mackintosh, MD  dicyclomine (BENTYL) 20 MG tablet Take 1 tablet (20 mg total) by mouth 2 (two) times daily. 12/13/19   Moshe Cipro, NP  ondansetron (ZOFRAN) 4 MG tablet Take 1 tablet (4 mg total) by mouth every 8 (eight) hours as needed for nausea or vomiting. 03/08/18   Margaree Mackintosh, MD  triamterene-hydrochlorothiazide [facility]) 37.5-25 MG tablet TAKE 1/2 TABLET  BY MOUTH DAILY 08/17/19   Margaree Mackintosh, MD    Family History Family History  Problem Relation Age of Onset  . Kidney failure Mother     Social History Social History   Tobacco Use  . Smoking status: Never Smoker  . Smokeless tobacco: Never Used  Substance Use Topics  . Alcohol use: Yes    Comment: social liqour/wine  . Drug use: No     Allergies   Patient has no known allergies.   Review of Systems Review of Systems  Constitutional: Negative for chills and fever.  HENT: Negative for ear pain and sore throat.   Eyes: Negative for pain and visual disturbance.  Respiratory: Negative for cough, shortness of breath, wheezing and stridor.   Cardiovascular: Negative for chest pain and palpitations.  Gastrointestinal: Positive for abdominal pain, nausea and vomiting.  Endocrine: Negative.   Genitourinary: Negative for dysuria and hematuria.  Musculoskeletal: Negative for arthralgias and back pain.  Skin: Negative for color change and rash.  Allergic/Immunologic: Negative.   Neurological: Negative for seizures and syncope.  Hematological: Negative.   Psychiatric/Behavioral: Negative.   All other systems reviewed and are negative.    Physical Exam Triage Vital Signs ED Triage Vitals  Enc Vitals Group     BP 12/13/19 0950 130/75     Pulse Rate 12/13/19 0950 84     Resp 12/13/19 0950 18     Temp 12/13/19  0950 98.4 F (36.9 C)     Temp Source 12/13/19 0950 Oral     SpO2 12/13/19 0950 100 %     Weight --      Height --      Head Circumference --      Peak Flow --      Pain Score 12/13/19 0951 0     Pain Loc --      Pain Edu? --      Excl. in GC? --    No data found.  Updated Vital Signs BP 130/75 (BP Location: Right Arm)   Pulse 84   Temp 98.4 F (36.9 C) (Oral)   Resp 18   LMP 11/15/2019 (Exact Date)   SpO2 100%   Visual Acuity Right Eye Distance:   Left Eye Distance:   Bilateral Distance:    Right Eye Near:   Left Eye Near:    Bilateral Near:      Physical Exam Vitals and nursing note reviewed.  Constitutional:      General: She is not in acute distress.    Appearance: She is well-developed. She is not ill-appearing.  HENT:     Head: Normocephalic and atraumatic.  Eyes:     Conjunctiva/sclera: Conjunctivae normal.  Cardiovascular:     Rate and Rhythm: Normal rate and regular rhythm.     Heart sounds: No murmur.  Pulmonary:     Effort: Pulmonary effort is normal. No respiratory distress.     Breath sounds: Normal breath sounds.  Abdominal:     General: Bowel sounds are decreased. There is no distension or abdominal bruit. There are no signs of injury.     Palpations: Abdomen is soft. There is no shifting dullness, fluid wave, hepatomegaly, splenomegaly, mass or pulsatile mass.     Tenderness: There is no abdominal tenderness.     Hernia: No hernia is present.  Musculoskeletal:     Cervical back: Neck supple.  Skin:    General: Skin is warm and dry.     Capillary Refill: Capillary refill takes less than 2 seconds.  Neurological:     General: No focal deficit present.     Mental Status: She is alert and oriented to person, place, and time.  Psychiatric:        Mood and Affect: Mood normal.        Behavior: Behavior normal.      UC Treatments / Results  Labs (all labs ordered are listed, but only abnormal results are displayed) Labs Reviewed  CBC WITH DIFFERENTIAL/PLATELET  COMPREHENSIVE METABOLIC PANEL    EKG   Radiology DG Abd 2 Views  Result Date: 12/13/2019 CLINICAL DATA:  Vomiting since Monday. EXAM: ABDOMEN - 2 VIEW COMPARISON:  None. FINDINGS: The lung bases are clear. The bowel gas pattern is unremarkable. No findings for obstruction or perforation. The soft tissue shadows are maintained. No worrisome calcifications. The bony structures are unremarkable. IMPRESSION: Unremarkable abdominal radiograph. Electronically Signed   By: Rudie Meyer M.D.   On: 12/13/2019 10:36    Procedures Procedures  (including critical care time)  Medications Ordered in UC Medications - No data to display  Initial Impression / Assessment and Plan / UC Course  I have reviewed the triage vital signs and the nursing notes.  Pertinent labs & imaging results that were available during my care of the patient were reviewed by me and considered in my medical decision making (see chart for details).     Presenting for  abdominal pain, nausea, vomiting for 3 days.  Unable to tolerate solid foods.  Has been able to tolerate liquids and has maintain hydration.  Abdominal x-ray today negative.  Ambulatory referral to GI today.  Bentyl sent to patient's pharmacy to take twice daily by mouth as needed for abdominal pain and cramping.  Patient states she has Zofran at home, prescription not sent today.  Instructed to go to the ER if her pain is unbearable and she cannot handle it.  Otherwise instructed patient to follow-up with GI.  Drew CBC and CMP today, will follow up with patient once these results are back. Final Clinical Impressions(s) / UC Diagnoses   Final diagnoses:  Left upper quadrant abdominal pain  Nausea and vomiting, intractability of vomiting not specified, unspecified vomiting type     Discharge Instructions     Your x-ray today was negative.  I am still suspecting that it could be her gallbladder.  We will go ahead and draw CBC and metabolic panel to check liver and kidneys as well as for infection.  We will call you when we get this back, it should be today but at the latest it will be in the morning.  I have gone ahead and put in a referral for GI.  They will probably call you today.  I would advise you to follow-up with him either way.    ED Prescriptions    Medication Sig Dispense Auth. Provider   dicyclomine (BENTYL) 20 MG tablet Take 1 tablet (20 mg total) by mouth 2 (two) times daily. 20 tablet Faustino Congress, NP     I have reviewed the PDMP during this encounter.   Faustino Congress, NP 12/13/19 [phone]

## 2019-12-19 ENCOUNTER — Other Ambulatory Visit: Payer: Self-pay | Admitting: Internal Medicine

## 2019-12-19 ENCOUNTER — Other Ambulatory Visit: Payer: Self-pay

## 2019-12-19 DIAGNOSIS — I10 Essential (primary) hypertension: Secondary | ICD-10-CM

## 2019-12-19 DIAGNOSIS — E7800 Pure hypercholesterolemia, unspecified: Secondary | ICD-10-CM

## 2019-12-19 DIAGNOSIS — Z0000 Encounter for general adult medical examination without abnormal findings: Secondary | ICD-10-CM

## 2019-12-19 DIAGNOSIS — Z841 Family history of disorders of kidney and ureter: Secondary | ICD-10-CM

## 2019-12-19 DIAGNOSIS — E559 Vitamin D deficiency, unspecified: Secondary | ICD-10-CM

## 2019-12-19 DIAGNOSIS — Z1329 Encounter for screening for other suspected endocrine disorder: Secondary | ICD-10-CM

## 2019-12-19 LAB — COMPLETE METABOLIC PANEL WITH GFR
AG Ratio: 1.3 (calc) (ref 1.0–2.5)
ALT: 20 U/L (ref 6–29)
AST: 12 U/L (ref 10–30)
Albumin: 4.3 g/dL (ref 3.6–5.1)
Alkaline phosphatase (APISO): 146 U/L — ABNORMAL HIGH (ref 31–125)
BUN: 8 mg/dL (ref 7–25)
CO2: 30 mmol/L (ref 20–32)
Calcium: 9.7 mg/dL (ref 8.6–10.2)
Chloride: 100 mmol/L (ref 98–110)
Creat: 0.63 mg/dL (ref 0.50–1.10)
GFR, Est African American: 126 mL/min/{1.73_m2} (ref >=60)
GFR, Est Non African American: 109 mL/min/{1.73_m2} (ref >=60)
Globulin: 3.4 g/dL (calc) (ref 1.9–3.7)
Glucose, Bld: 100 mg/dL — ABNORMAL HIGH (ref 65–99)
Potassium: 4.4 mmol/L (ref 3.5–5.3)
Sodium: 138 mmol/L (ref 135–146)
Total Bilirubin: 0.6 mg/dL (ref 0.2–1.2)
Total Protein: 7.7 g/dL (ref 6.1–8.1)

## 2019-12-19 LAB — CBC WITH DIFFERENTIAL/PLATELET
Absolute Monocytes: 338 cells/uL (ref 200–950)
Basophils Absolute: 42 cells/uL (ref 0–200)
Basophils Relative: 0.9 %
Eosinophils Absolute: 150 cells/uL (ref 15–500)
Eosinophils Relative: 3.2 %
HCT: 39.8 % (ref 35.0–45.0)
Hemoglobin: 13 g/dL (ref 11.7–15.5)
Lymphs Abs: 1241 cells/uL (ref 850–3900)
MCH: 27.5 pg (ref 27.0–33.0)
MCHC: 32.7 g/dL (ref 32.0–36.0)
MCV: 84.3 fL (ref 80.0–100.0)
MPV: 10.5 fL (ref 7.5–12.5)
Monocytes Relative: 7.2 %
Neutro Abs: 2928 cells/uL (ref 1500–7800)
Neutrophils Relative %: 62.3 %
Platelets: 382 10*3/uL (ref 140–400)
RBC: 4.72 10*6/uL (ref 3.80–5.10)
RDW: 14 % (ref 11.0–15.0)
Total Lymphocyte: 26.4 %
WBC: 4.7 10*3/uL (ref 3.8–10.8)

## 2019-12-19 LAB — TSH: TSH: 1.72 mIU/L

## 2019-12-19 LAB — LIPID PANEL
Cholesterol: 208 mg/dL — ABNORMAL HIGH (ref <200)
HDL: 41 mg/dL — ABNORMAL LOW (ref >=50)
LDL Cholesterol (Calc): 144 mg/dL (calc) — ABNORMAL HIGH
Non-HDL Cholesterol (Calc): 167 mg/dL (calc) — ABNORMAL HIGH (ref <130)
Total CHOL/HDL Ratio: 5.1 (calc) — ABNORMAL HIGH (ref <5.0)
Triglycerides: 110 mg/dL (ref <150)

## 2019-12-19 LAB — VITAMIN D 25 HYDROXY (VIT D DEFICIENCY, FRACTURES): Vit D, 25-Hydroxy: 12 ng/mL — ABNORMAL LOW (ref 30–100)

## 2019-12-22 ENCOUNTER — Other Ambulatory Visit: Payer: Self-pay | Admitting: Internal Medicine

## 2019-12-22 ENCOUNTER — Ambulatory Visit (INDEPENDENT_AMBULATORY_CARE_PROVIDER_SITE_OTHER): Payer: Self-pay | Admitting: Internal Medicine

## 2019-12-22 ENCOUNTER — Encounter: Payer: Self-pay | Admitting: Internal Medicine

## 2019-12-22 ENCOUNTER — Other Ambulatory Visit: Payer: Self-pay

## 2019-12-22 VITALS — BP 102/84 | HR 86 | Temp 98.4°F | Ht 62.0 in | Wt 160.0 lb

## 2019-12-22 DIAGNOSIS — I10 Essential (primary) hypertension: Secondary | ICD-10-CM

## 2019-12-22 DIAGNOSIS — R109 Unspecified abdominal pain: Secondary | ICD-10-CM

## 2019-12-22 DIAGNOSIS — K8000 Calculus of gallbladder with acute cholecystitis without obstruction: Secondary | ICD-10-CM

## 2019-12-22 DIAGNOSIS — R7989 Other specified abnormal findings of blood chemistry: Secondary | ICD-10-CM

## 2019-12-22 DIAGNOSIS — Z0000 Encounter for general adult medical examination without abnormal findings: Secondary | ICD-10-CM

## 2019-12-22 DIAGNOSIS — R8290 Unspecified abnormal findings in urine: Secondary | ICD-10-CM

## 2019-12-22 LAB — POCT URINALYSIS DIPSTICK
Bilirubin, UA: NEGATIVE
Blood, UA: NEGATIVE
Glucose, UA: NEGATIVE
Ketones, UA: NEGATIVE
Nitrite, UA: NEGATIVE
Protein, UA: POSITIVE — AB
Spec Grav, UA: 1.025 (ref 1.010–1.025)
Urobilinogen, UA: 0.2 E.U./dL
pH, UA: 5 (ref 5.0–8.0)

## 2019-12-22 MED ORDER — VITAMIN D (ERGOCALCIFEROL) 1.25 MG (50000 UNIT) PO CAPS: 50000.0000 [IU] | ORAL_CAPSULE | ORAL | 3 refills | Status: DC

## 2019-12-22 MED ORDER — TRIAMTERENE-HCTZ 37.5-25 MG PO TABS: ORAL_TABLET | ORAL | 5 refills | Status: DC

## 2019-12-22 NOTE — Progress Notes (Signed)
   Subjective:    Patient ID: Theresa Velasquez, female    DOB: [birth year], 44 y.o.   MRN: [id number]  HPI 44 year old Female seen today for health maintenance exam and evaluation of medical issues.  Patient was seen at urgent care March 31 with left upper quadrant abdominal pain.  Notes indicate she was experiencing abdominal pain rather severe about an hour after she ate solid food.  She reported nausea and vomiting after eating.  She could tolerate liquids fine with no pain nausea or vomiting.  History of GE reflux.  Was afebrile at the time with stable blood pressure.  Notes indicate bowel sounds were decreased and there was no hepatosplenomegaly masses or significant tenderness.  However patient had SGOT of 99, SGPT of 137, alkaline phosphatase of 180 and total bilirubin of 1.6.  1 year ago these labs were normal.  She was last seen here in June 2019.  At that time she indicated she had been seen at [facility] and was told she had lumbar disc disease at L5 having received a steroid injection there was some improvement.  GYN care is done through [facility].  History of essential hypertension treated with Maxide 25.    No history of serious illnesses accidents or operations.  No known drug allergies.  2 pregnancies and 1 miscarriage.  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  She is single.  Does not smoke.  Social alcohol consumption.  She has a daughter and 2 sons both in their 1s and are healthy.  She lives with her son.    Review of Systems abdominal pain has improved since urgent care visit March 31     Objective:   Physical Exam Blood pressure 102/84 BMI 29.26 pulse 86 temperature 98.4 pulse oximetry 99% weight 160 pounds  Skin warm and dry.  Nodes none.  TMs are clear.  Neck is supple without JVD thyromegaly or carotid bruits.  Chest is clear to auscultation.  Cardiac exam regular rate and rhythm  normal S1 and S2 without murmurs or gallops.  Abdomen soft nondistended without hepatosplenomegaly..       Assessment & Plan:  Elevated liver functions.  On March 31 SGOT was 99 and SGPT was 137.  These are now normal but alkaline phosphatase is elevated at 146 and was not measured on March 31.  I feel patient likely has gallstones and had attack of cholecystitis based on these labs.  She will have ultrasound of the gallbladder in the near future.  Essential hypertension-stable on current regimen  Plan: She will eat bland diet and slowly advance her diet.  Avoid greasy or fatty foods as well as fried foods.  Schedule ultrasound of the gallbladder in the near future.  Addendum: Gallbladder ultrasound shows multiple calculi up to 13 mm in diameter.  No pericholecystic fluid.  Patient was contacted with results.  Surgery consultation advised but she wants to wait until she returns from trip to  [place].

## 2019-12-22 NOTE — Progress Notes (Deleted)
   Subjective:    Patient ID: Theresa Velasquez, female    DOB: 1976-08-22, 44 y.o.   MRN: [id number]  HPI    Review of Systems     Objective:   Physical Exam        Assessment & Plan:

## 2019-12-23 LAB — LIPASE: Lipase: 17 U/L (ref 7–60)

## 2019-12-24 LAB — URINE CULTURE
MICRO NUMBER:: 10346407
Result:: NO GROWTH
SPECIMEN QUALITY:: ADEQUATE

## 2019-12-27 ENCOUNTER — Ambulatory Visit
Admission: RE | Admit: 2019-12-27 | Discharge: 2019-12-27 | Disposition: A | Discharge status: Home / Self Care | Payer: No Typology Code available for payment source | Source: Ambulatory Visit | Attending: Internal Medicine | Admitting: Internal Medicine

## 2019-12-27 DIAGNOSIS — R7989 Other specified abnormal findings of blood chemistry: Secondary | ICD-10-CM

## 2019-12-27 DIAGNOSIS — R109 Unspecified abdominal pain: Secondary | ICD-10-CM

## 2019-12-28 NOTE — Patient Instructions (Signed)
Continue medication for hypertension.  Follow a bland diet.  Avoid fatty greasy foods and fried foods.  We can arrange for surgical consultation appointment after  May 1 is you requested.

## 2020-01-02 ENCOUNTER — Telehealth: Payer: Self-pay | Admitting: Internal Medicine

## 2020-01-02 ENCOUNTER — Encounter: Payer: Self-pay | Admitting: Internal Medicine

## 2020-01-02 NOTE — Telephone Encounter (Signed)
Patient has appt with Dr. Corliss Skains at [facility] May 12 at 2:20 pm. Have notified patient by mail. MJB,MD

## 2020-01-24 ENCOUNTER — Ambulatory Visit: Payer: Self-pay | Admitting: Surgery

## 2020-01-24 NOTE — H&P (Signed)
History of Present Illness Theresa Velasquez. Theresa Lees MD; 01/24/2020 4:46 PM) The patient is a 44 year old female who presents for evaluation of gall stones. Referred by Dr. Eden Emms Baxley for gallstones  This is a healthy 44 year old female who presents with a two-month history of intermittent epigastric and right-sided abdominal pain radiating through to her back associated with some nausea and vomiting as well as abdominal bloating. She underwent ultrasound that showed multiple gallstones with no sign of acute cholecystitis. She did have one panel of liver function tests that showed a bilirubin elevated to 1.6 and AST ALT slightly elevated. Subsequently she had a repeat set of labs that showed normal liver function tests. She notices no change in her bowel movements. She denies any diarrhea. She presents now to discuss cholecystectomy. No previous surgeries.  CLINICAL DATA: Abdominal pain, elevated LFTs  EXAM: ULTRASOUND ABDOMEN LIMITED RIGHT UPPER QUADRANT  COMPARISON: None  FINDINGS: Gallbladder:  Multiple shadowing calculi in gallbladder up to 13 mm diameter. Upper normal gallbladder wall thickness. No pericholecystic fluid or sonographic Murphy sign.  Common bile duct:  Diameter: 5 mm, normal  Liver:  Normal echogenicity without mass or nodularity. No intrahepatic biliary dilatation. Portal vein is patent on color Doppler imaging with normal direction of blood flow towards the liver.  Other: No RIGHT upper quadrant free fluid.  IMPRESSION: Cholelithiasis without evidence of acute cholecystitis or biliary dilatation.   Electronically Signed By: Theresa Velasquez M.D. On: 12/27/2019 09:53   Problem List/Past Medical Theresa Hazard K. Berania Peedin, MD; 01/24/2020 4:46 PM) CHRONIC CHOLECYSTITIS WITH CALCULUS (K80.10)  Past Surgical History (Theresa Velasquez, Velasquez; 01/24/2020 2:14 PM) No pertinent past surgical history  Diagnostic Studies History (Theresa Velasquez, Velasquez; 01/24/2020 2:14  PM) Colonoscopy never Mammogram 1-3 years ago Pap Smear 1-5 years ago  Allergies (Theresa Velasquez, Velasquez; 01/24/2020 2:15 PM) No Known Drug Allergies [01/24/2020]: Allergies Reconciled  Medication History (Theresa Velasquez, Velasquez; 01/24/2020 2:15 PM) Triamterene-HCTZ (37.5-25MG  Tablet, Oral) Active. Vitamin D (Ergocalciferol) (1.25 MG(50000 UT) Capsule, Oral) Active. Medications Reconciled  Social History Theresa Velasquez, Velasquez; 01/24/2020 2:14 PM) Alcohol use Occasional alcohol use. Caffeine use Tea. No drug use Tobacco use Never smoker.  Family History Theresa Velasquez, Velasquez; 01/24/2020 2:14 PM) Hypertension Mother. Kidney Disease Mother. Thyroid problems Mother.  Pregnancy / Birth History Theresa Velasquez, Velasquez; 01/24/2020 2:14 PM) Age at menarche 14 years. Gravida 2 Maternal age 53-20 Para 2 Regular periods  Other Problems Theresa Velasquez. Theresa Vanrossum, MD; 01/24/2020 4:46 PM) Cholelithiasis Gastroesophageal Reflux Disease High blood pressure Other disease, cancer, significant illness     Review of Systems (Theresa Velasquez; 01/24/2020 2:14 PM) General Not Present- Appetite Loss, Chills, Fatigue, Fever, Night Sweats, Weight Gain and Weight Loss. Skin Not Present- Change in Wart/Mole, Dryness, Hives, Jaundice, New Lesions, Non-Healing Wounds, Rash and Ulcer. HEENT Present- Wears glasses/contact lenses. Not Present- Earache, Hearing Loss, Hoarseness, Nose Bleed, Oral Ulcers, Ringing in the Ears, Seasonal Allergies, Sinus Pain, Sore Throat, Visual Disturbances and Yellow Eyes. Respiratory Not Present- Bloody sputum, Chronic Cough, Difficulty Breathing, Snoring and Wheezing. Breast Not Present- Breast Mass, Breast Pain, Nipple Discharge and Skin Changes. Cardiovascular Not Present- Chest Pain, Difficulty Breathing Lying Down, Leg Cramps, Palpitations, Rapid Heart Rate, Shortness of Breath and Swelling of Extremities. Gastrointestinal Not Present- Abdominal Pain, Bloating, Bloody Stool,  Change in Bowel Habits, Chronic diarrhea, Constipation, Difficulty Swallowing, Excessive gas, Gets full quickly at meals, Hemorrhoids, Indigestion, Nausea, Rectal Pain and Vomiting. Female Genitourinary Not Present- Frequency, Nocturia, Painful Urination, Pelvic Pain and Urgency. Musculoskeletal Not  Present- Back Pain, Joint Pain, Joint Stiffness, Muscle Pain, Muscle Weakness and Swelling of Extremities. Neurological Not Present- Decreased Memory, Fainting, Headaches, Numbness, Seizures, Tingling, Tremor, Trouble walking and Weakness. Psychiatric Not Present- Anxiety, Bipolar, Change in Sleep Pattern, Depression, Fearful and Frequent crying. Endocrine Not Present- Cold Intolerance, Excessive Hunger, Hair Changes, Heat Intolerance, Hot flashes and New Diabetes. Hematology Not Present- Blood Thinners, Easy Bruising, Excessive bleeding, Gland problems, HIV and Persistent Infections.  Vitals (Theresa Velasquez; 01/24/2020 2:15 PM) 01/24/2020 2:15 PM Weight: 163 lb Height: 63in Body Surface Area: 1.77 m Body Mass Index: 28.87 kg/m  Temp.: 98.68F  Pulse: 54 (Regular)  BP: 110/80(Sitting, Left Arm, Standard)        Physical Exam Theresa Key K. Meiko Stranahan MD; 01/24/2020 4:46 PM)  The physical exam findings are as follows: Note:Constitutional: WDWN in NAD, conversant, no obvious deformities Eyes: Pupils equal, round; sclera anicteric; moist conjunctiva; no lid lag HENT: Oral mucosa moist; good dentition Neck: No masses palpated, trachea midline; no thyromegaly Lungs: CTA bilaterally; normal respiratory effort CV: Regular rate and rhythm; no murmurs; extremities well-perfused with no edema Abd: +bowel sounds, soft, non-tender, no palpable organomegaly; no palpable hernias Musc: Normal gait; no apparent clubbing or cyanosis in extremities Lymphatic: No palpable cervical or axillary lymphadenopathy Skin: Warm, dry; no sign of jaundice Psychiatric - alert and oriented x 4; calm mood and  affect    Assessment & Plan Theresa Key K. Tabetha Haraway MD; 01/24/2020 2:46 PM)  CHRONIC CHOLECYSTITIS WITH CALCULUS (K80.10)  Current Plans Schedule for Surgery - Laparoscopic cholecystectomy with intraoperative cholangiogram. The surgical procedure has been discussed with the patient. Potential risks, benefits, alternative treatments, and expected outcomes have been explained. All of the patient's questions at this time have been answered. The likelihood of reaching the patient's treatment goal is good. The patient understand the proposed surgical procedure and wishes to proceed.  Imogene Burn. Georgette Dover, MD, [facility] Surgery  General/ Trauma Surgery   01/24/2020 4:47 PM

## 2021-06-24 IMAGING — DX DG ABDOMEN 2V
2 series · 2 of 2 positions shown · non-contrast
Comparison: None.

CLINICAL DATA: Vomiting since [REDACTED].

EXAM:
ABDOMEN - 2 VIEW

[abdomen erect]
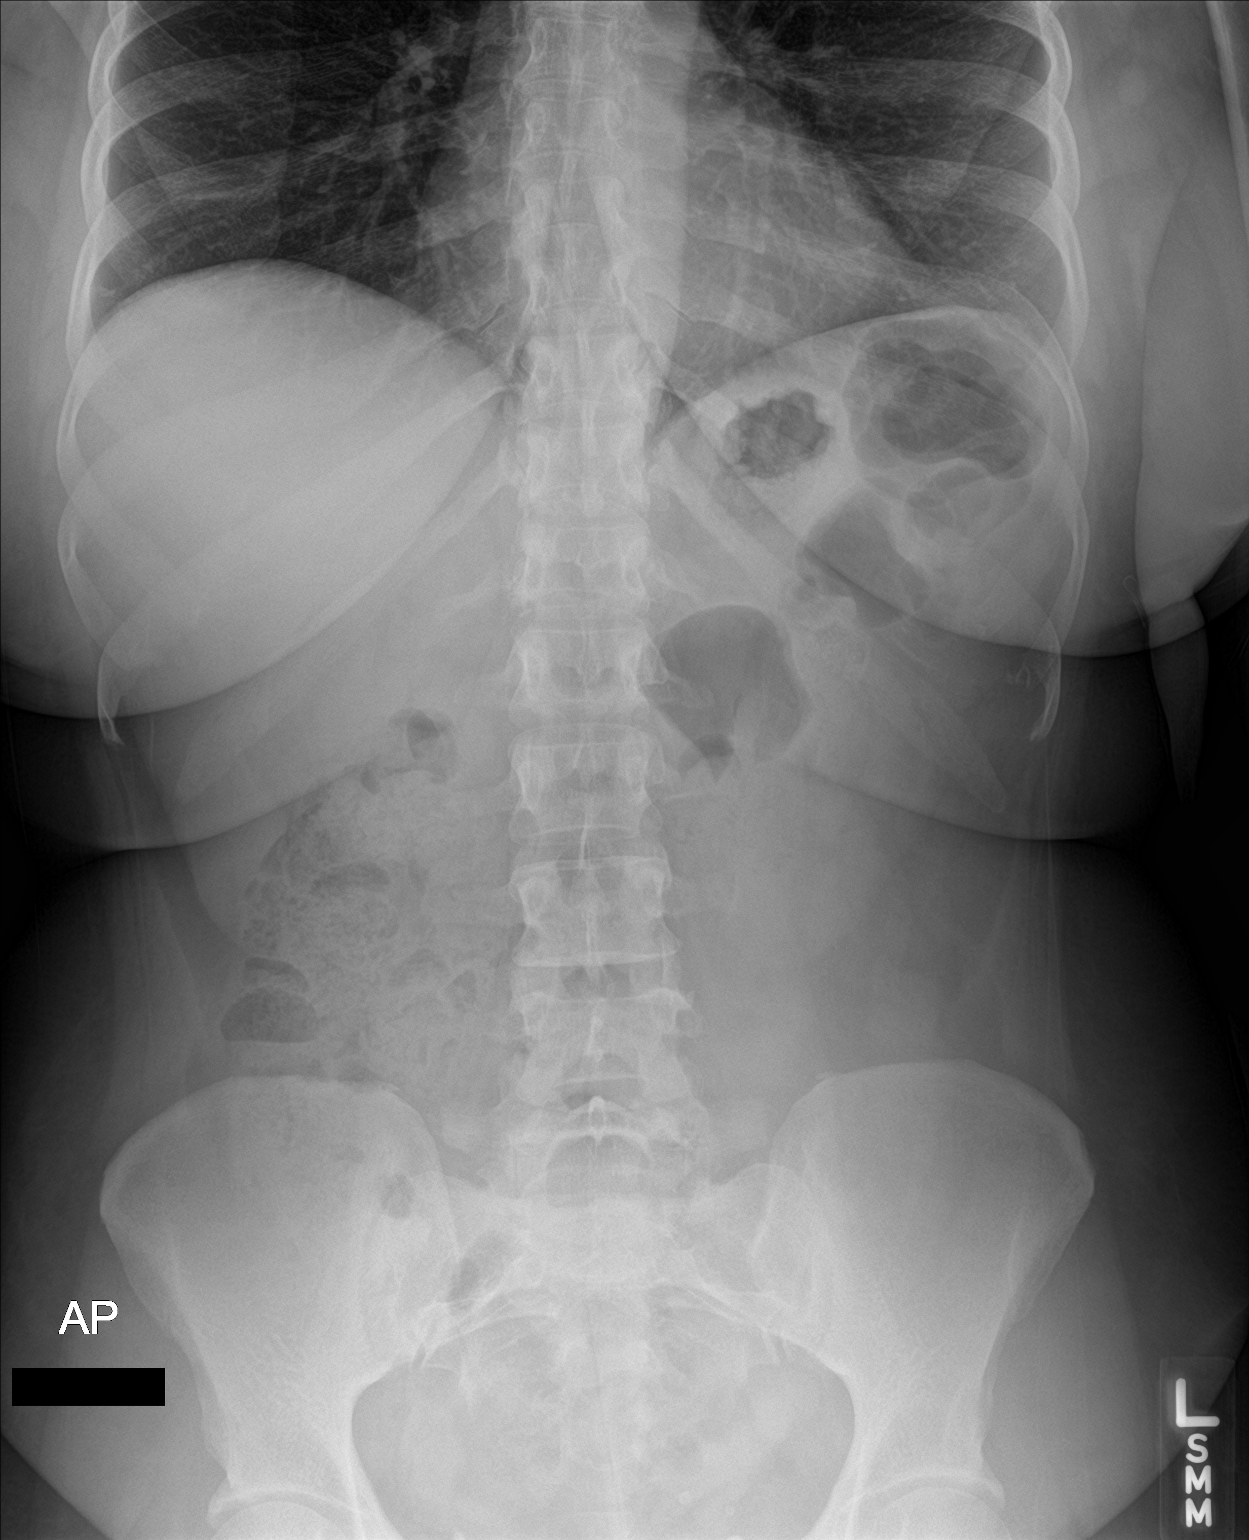

[abdomen supine]
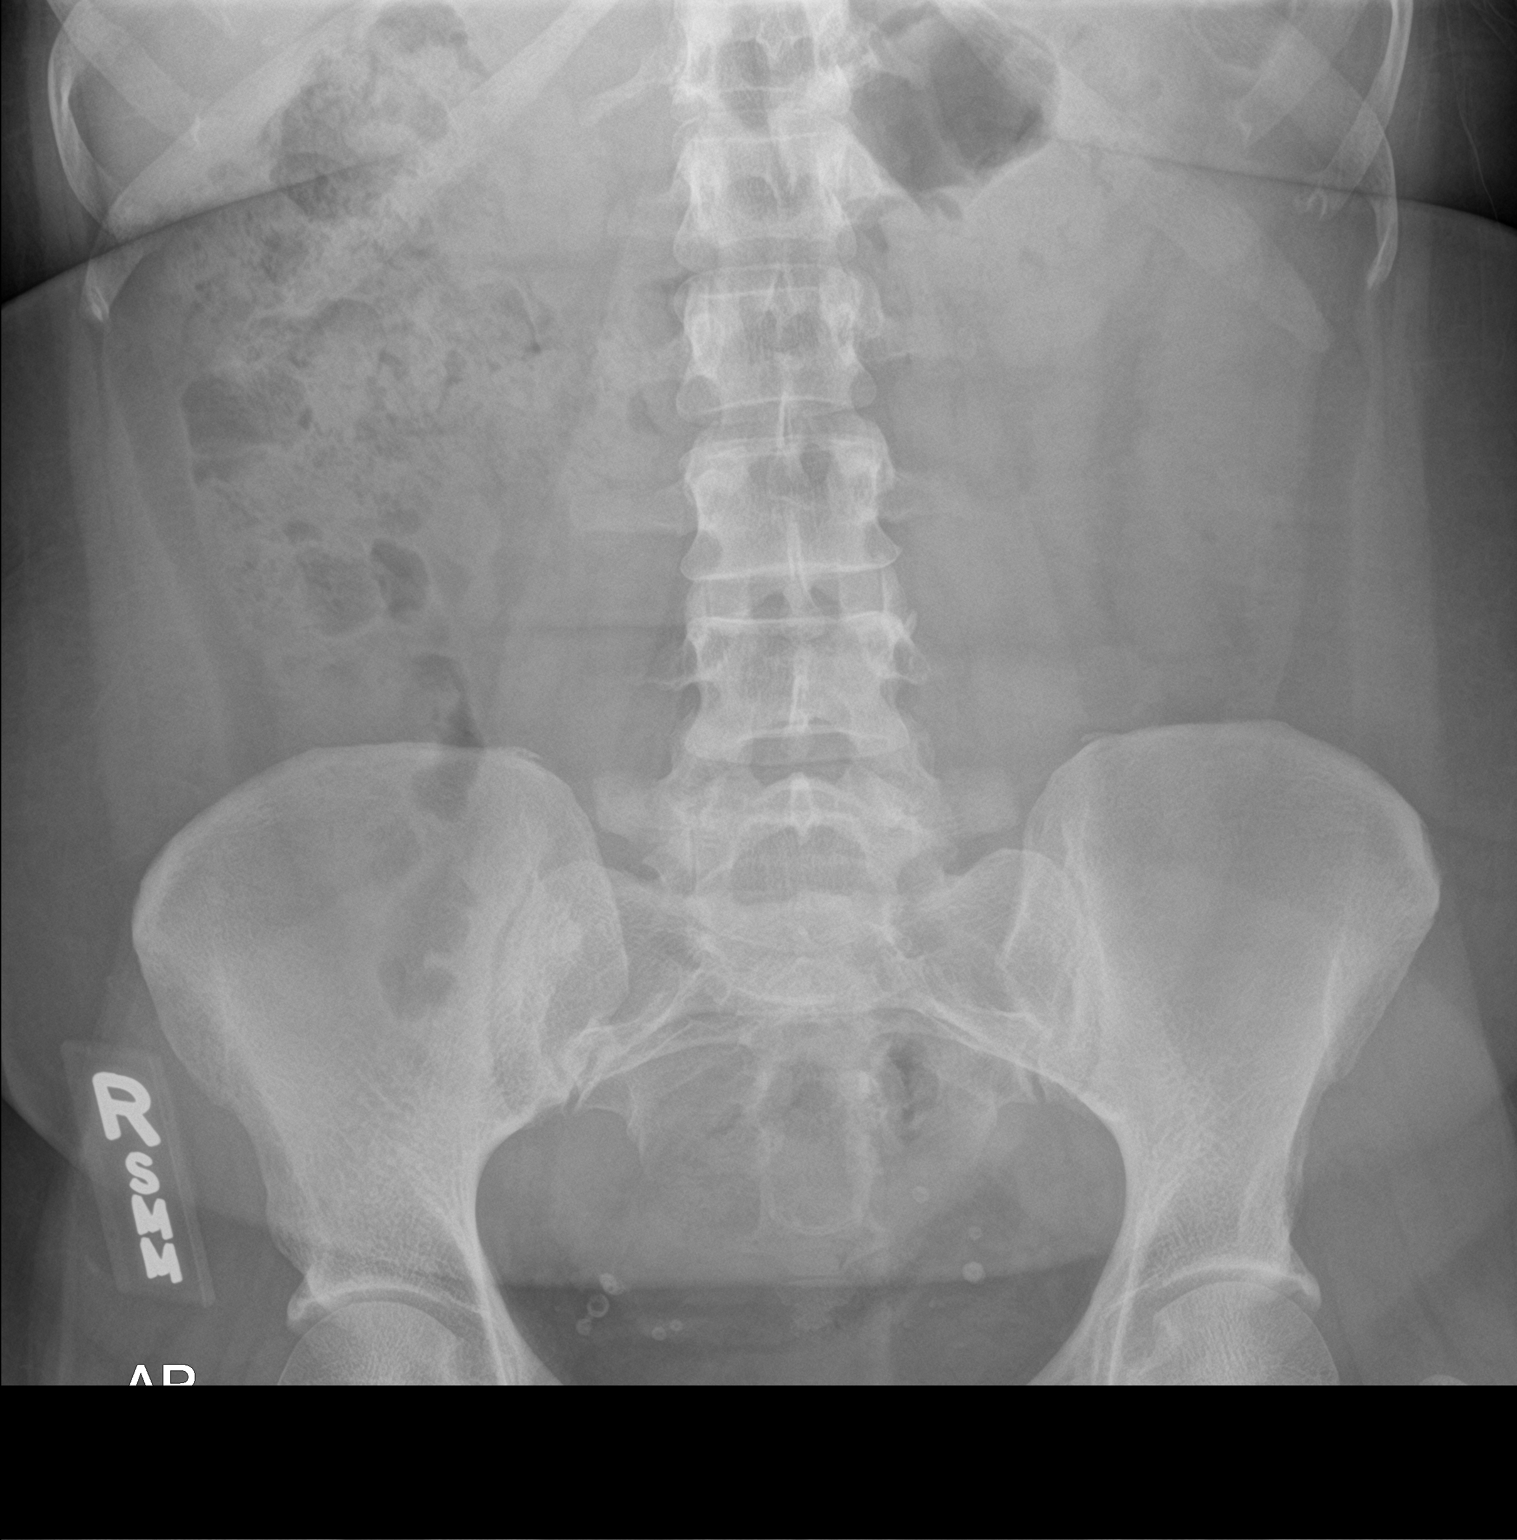

[2 of 2 positions shown; findings below may reference images not displayed]

FINDINGS: The lung bases are clear.

The bowel gas pattern is unremarkable. No findings for obstruction
or perforation. The soft tissue shadows are maintained. No worrisome
calcifications. The bony structures are unremarkable.
IMPRESSION: Unremarkable abdominal radiograph.

## 2021-07-08 IMAGING — US US ABDOMEN LIMITED
1 series · 14 of 25 positions shown · non-contrast
Comparison: None

CLINICAL DATA: Abdominal pain, elevated LFTs

EXAM:
ULTRASOUND ABDOMEN LIMITED RIGHT UPPER QUADRANT

[Series 1: us abdomen limited · 0.22mm/px · 14 of 51 slices shown]
[im 1/51]
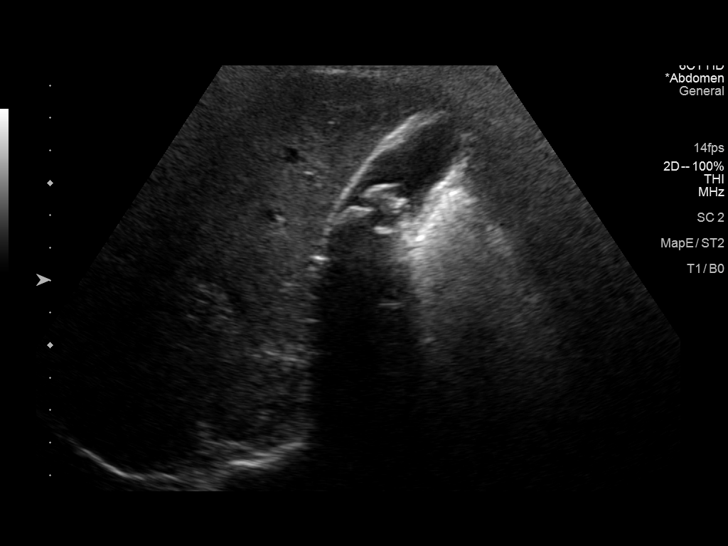
[im 5/51]
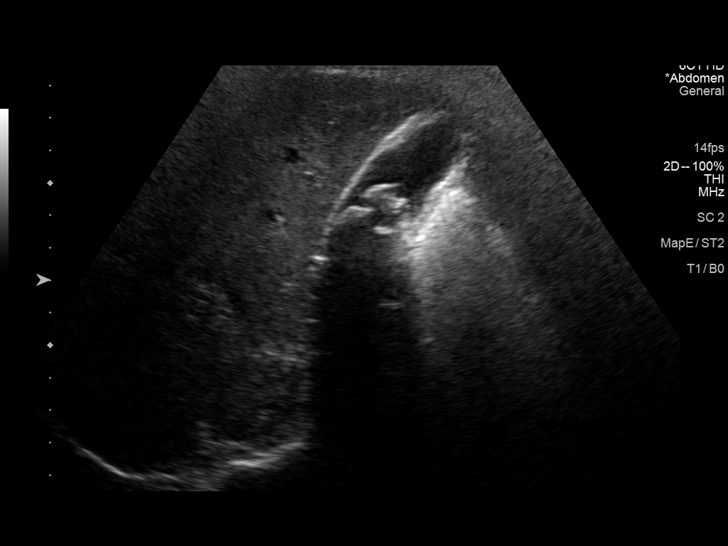
[im 9/51]
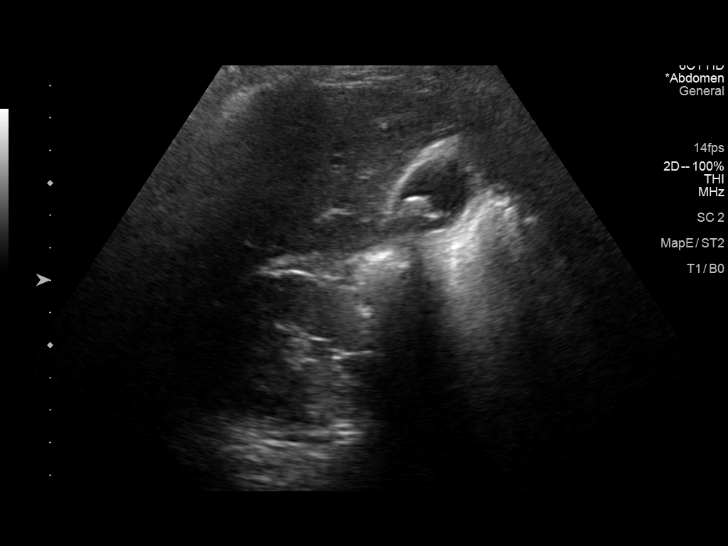
[im 13/51]
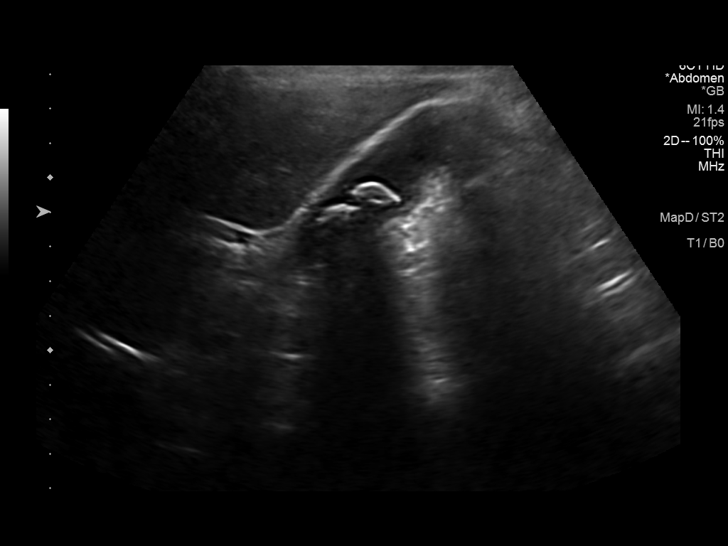
[im 17/51]
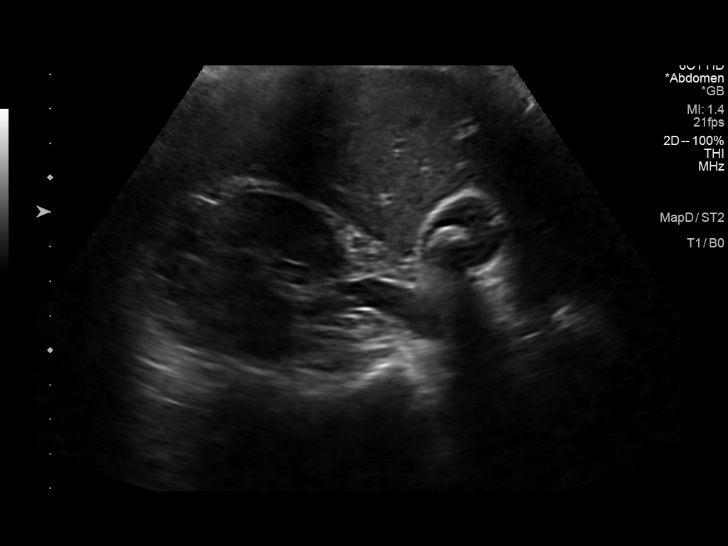
[im 19/51]
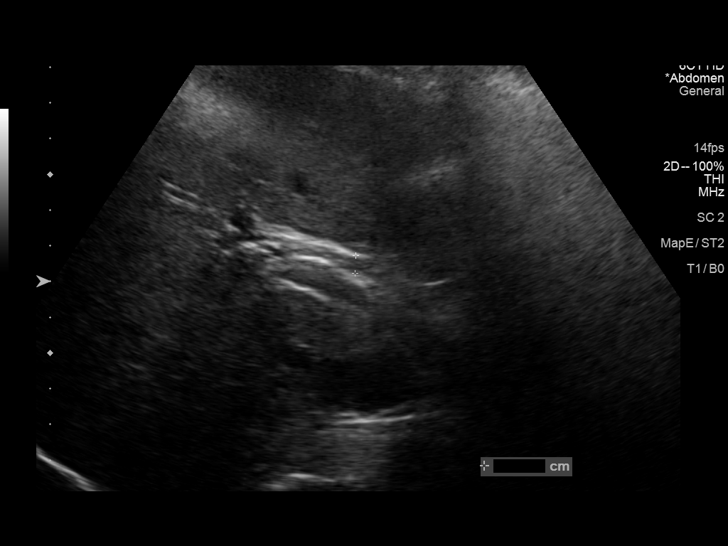
[im 23/51]
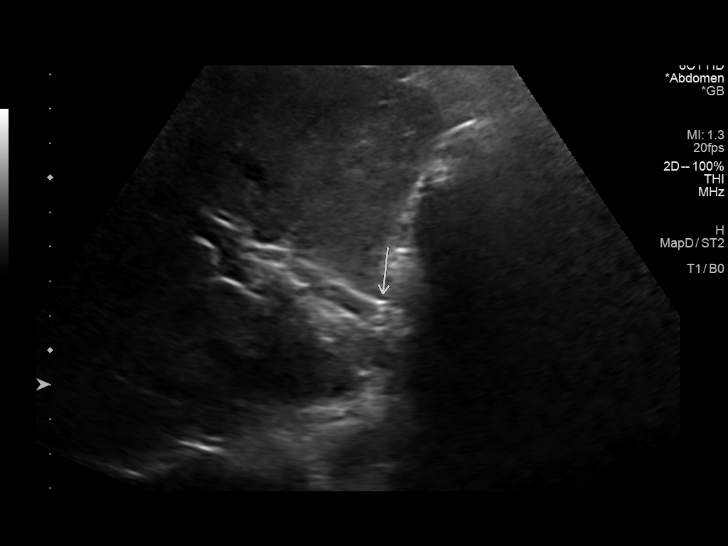
[im 28/51]
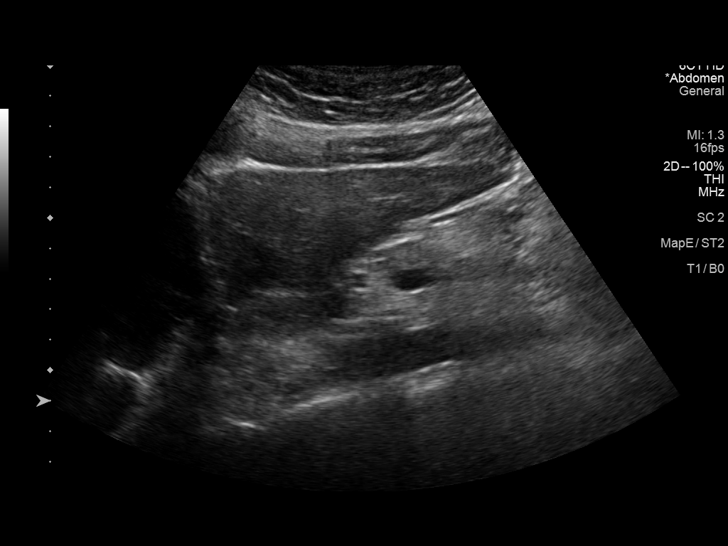
[im 32/51]
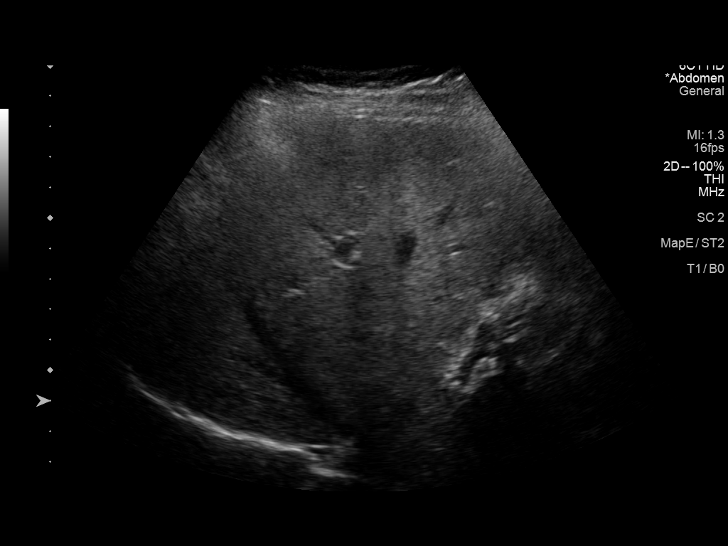
[im 34/51]
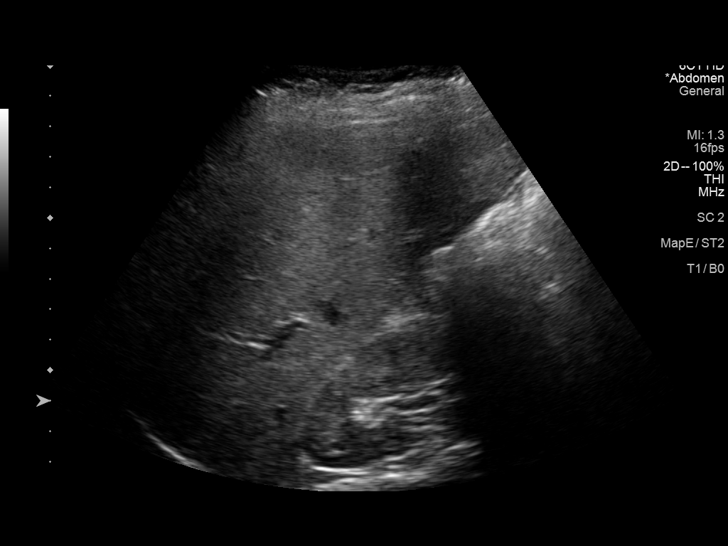
[im 38/51]
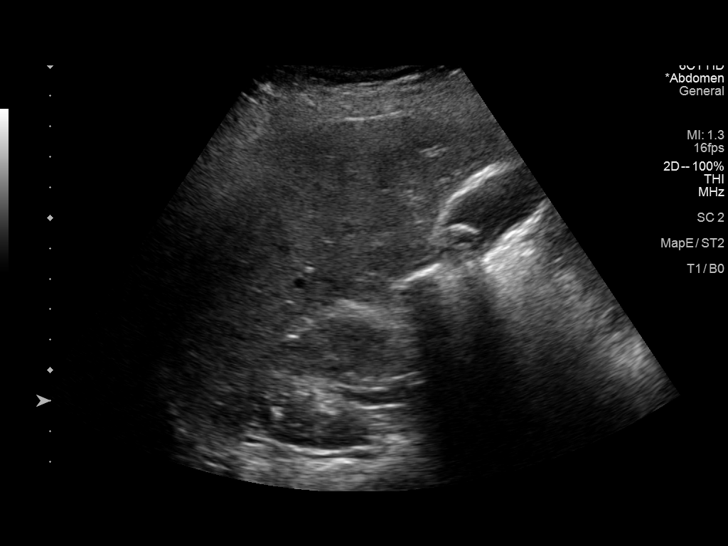
[im 42/51]
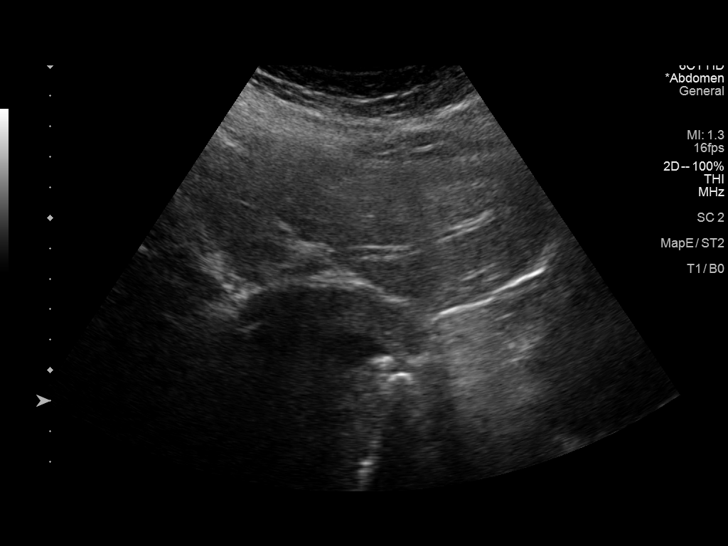
[im 46/51]
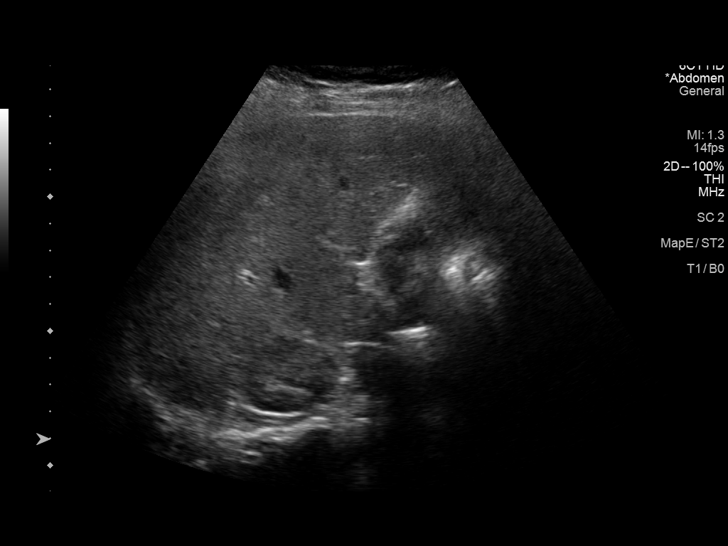
[im 51/51]
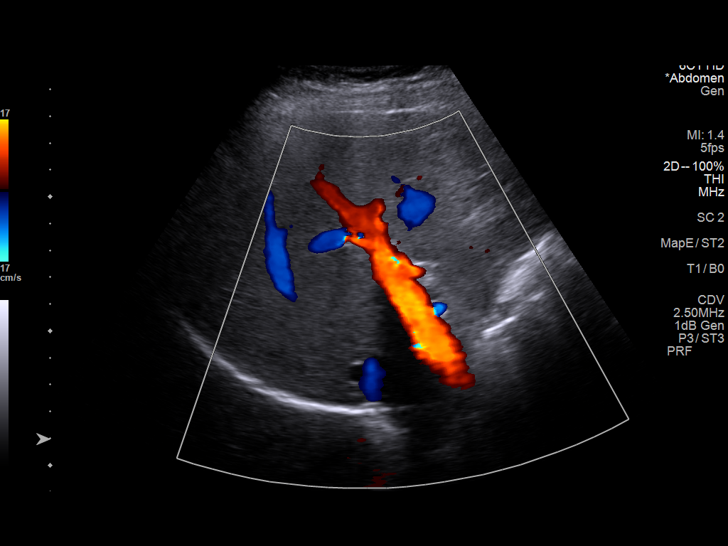

[14 of 25 positions shown; findings below may reference images not displayed]

FINDINGS: Gallbladder:

Multiple shadowing calculi in gallbladder up to 13 mm diameter.
Upper normal gallbladder wall thickness. No pericholecystic fluid or
sonographic Murphy sign.

Common bile duct:

Diameter: 5 mm, normal

Liver:

Normal echogenicity without mass or nodularity. No intrahepatic
biliary dilatation. Portal vein is patent on color Doppler imaging
with normal direction of blood flow towards the liver.

Other: No RIGHT upper quadrant free fluid.
IMPRESSION: Cholelithiasis without evidence of acute cholecystitis or biliary
dilatation.

## 2021-09-02 ENCOUNTER — Telehealth: Payer: Self-pay | Admitting: Internal Medicine

## 2021-09-02 NOTE — Telephone Encounter (Signed)
Theresa Velasquez [phone]  [name] called and said her pharmacy said for her to call PCP to get refill on below medication, that she had no refills. Last seen 12/22/2019.  triamterene-hydrochlorothiazide (MAXZIDE-25) 37.5-25 MG tablet  [id number] [place], [place] - [address] AT [facility] OF [address] Phone:  [phone]  Fax:  [phone]

## 2021-09-02 NOTE — Telephone Encounter (Signed)
scheduled

## 2021-09-04 ENCOUNTER — Other Ambulatory Visit: Payer: Self-pay

## 2021-09-04 ENCOUNTER — Encounter: Payer: Self-pay | Admitting: Internal Medicine

## 2021-09-04 ENCOUNTER — Ambulatory Visit: Payer: BC Managed Care – PPO | Admitting: Internal Medicine

## 2021-09-04 VITALS — BP 130/84 | HR 84 | Temp 97.8°F | Ht 61.25 in | Wt 171.0 lb

## 2021-09-04 DIAGNOSIS — I10 Essential (primary) hypertension: Secondary | ICD-10-CM

## 2021-09-04 MED ORDER — TRIAMTERENE-HCTZ 37.5-25 MG PO TABS: ORAL_TABLET | ORAL | 5 refills | Status: DC

## 2021-09-04 NOTE — Progress Notes (Signed)
° °  Subjective:    Patient ID: Theresa Velasquez, female    DOB: Jul 26, 1976, 45 y.o.   MRN: [id number]  HPI 45 year old Female seen for follow up on hypertension. She operates a day care full time and has been in school full time at [employer] where she will receive a Business degree this coming Spring. Needs Maxzide refilled. Will schedule CPE later after graduation this Spring.  Declines flu vaccine.  GYN care done through [facility].  History of essential hypertension treated with Maxide 25.  No history of serious illnesses, accidents or operations.  No known drug allergies.  2 pregnancies and 1 miscarriage.  She is Production designer, theatre/television/film for [employer].  She is single.  Does not smoke.  Social alcohol consumption.  3 adult children.  History of cholecystitis in the Spring 2021.  Has not had cholecystectomy.  She first presented to the office in February 2018 indicating she was trying to donate a kidney to her mother who had chronic kidney disease.  She underwent testing at [facility] but was told her blood pressure was elevated on 24-hour ambulatory blood pressure monitor.  She was turned down for kidney transplant.  Records revealed her 24-hour blood pressure monitor at [facility] showed a mean blood pressure of 145/95 with heart rate of 84.     Review of Systems Feels well with no new complaints     Objective:   Physical Exam  Blood pressure 130/84, pulse 84 temperature 97.8 degrees pulse oximetry 98% weight 171 pounds BMI 32.05  Skin: Warm and dry.  No thyromegaly or carotid bruits.  Chest is clear to auscultation without rales or wheezing.  Cardiac exam: Regular rate and rhythm without ectopy or murmur.  No lower extremity pitting edema.      Assessment & Plan:  Essential hypertension-well-controlled.  Continue Maxide 25 for hypertension.  She will be completing college studies this coming spring and we should book physical examination after  that.  Have refilled Maxide 25 (#30) with 5 refills.

## 2021-09-08 NOTE — Patient Instructions (Signed)
It was a pleasure to see you today.  Lets book physical examination after your graduation from college in May.  Continue Maxide 25 daily.  This is been refilled.

## 2021-09-14 ENCOUNTER — Emergency Department (HOSPITAL_BASED_OUTPATIENT_CLINIC_OR_DEPARTMENT_OTHER)
Admission: EM | Admit: 2021-09-14 | Discharge: 2021-09-14 | Disposition: A | Discharge status: Home / Self Care | Payer: Self-pay | Attending: Emergency Medicine | Admitting: Emergency Medicine

## 2021-09-14 ENCOUNTER — Encounter (HOSPITAL_BASED_OUTPATIENT_CLINIC_OR_DEPARTMENT_OTHER): Payer: Self-pay

## 2021-09-14 DIAGNOSIS — U071 COVID-19: Secondary | ICD-10-CM | POA: Insufficient documentation

## 2021-09-14 DIAGNOSIS — J028 Acute pharyngitis due to other specified organisms: Secondary | ICD-10-CM | POA: Insufficient documentation

## 2021-09-14 DIAGNOSIS — B9789 Other viral agents as the cause of diseases classified elsewhere: Secondary | ICD-10-CM | POA: Insufficient documentation

## 2021-09-14 LAB — RESP PANEL BY RT-PCR (FLU A&B, COVID) ARPGX2
Influenza A by PCR: NEGATIVE
Influenza B by PCR: NEGATIVE
SARS Coronavirus 2 by RT PCR: POSITIVE — AB

## 2021-09-14 LAB — GROUP A STREP BY PCR: Group A Strep by PCR: NOT DETECTED

## 2021-09-14 MED ORDER — HYDROCODONE-ACETAMINOPHEN 5-325 MG PO TABS: 1.0000 | ORAL_TABLET | Freq: Four times a day (QID) | ORAL | 0 refills | Status: DC | PRN

## 2021-09-14 NOTE — Discharge Instructions (Signed)
1.  See your doctor for recheck in 3 to 5 days. 2.  Return to emergency department if you have worsening or concerning symptoms.  You may develop additional symptoms of cough, body aches, fatigue and headache.  Rest, stay hydrated, take Vicodin for severe pain and cough. You may take plain acetaminophen for mild to moderate pain.  Take care not to exceed TOTAL Acetaminophen (Tylenol) limits of 4000mg  in a day.  Each Vicodin tablet contains 325 mg of acetaminophen (Tylenol) if you take 1 Vicodin tablet and 1 regular strength acetaminophen tablet you have taken 650 mg acetaminophen.  3.  You may drink tea with honey and do warm salt water gargles as well for throat pain.

## 2021-09-14 NOTE — ED Triage Notes (Signed)
She c/o uri sx since yesterday with mild, non-productive cough and mod. Sore throat. She is in no distress. She states she has just returned from a "7 day cruise".

## 2021-09-14 NOTE — ED Provider Notes (Signed)
[facility] EMERGENCY DEPT Provider Note   CSN: [id number] Arrival date & time: 09/14/21  4782     History  Chief Complaint  Patient presents with   URI    Theresa Velasquez is a 45 y.o. female.  HPI Patient history is hypertension treated with medications.  Patient reports she just got back from a cruise 2 days ago.  Once back she started to get a sore throat.  She reports that really her only symptom.  It is painful when she swallows in her throat.  She has tried some TheraFlu but got limited relief.  She is tried salt water gargles with limited relief.  No difficulty breathing.  She denies any fever, body ache or myalgia.  Patient is a non-smoker.  Prior history of frequent strep throat or other recurrent sore throat issues.    Home Medications Prior to Admission medications   Medication Sig Start Date End Date Taking? Authorizing Provider  HYDROcodone-acetaminophen (NORCO/VICODIN) 5-325 MG tablet Take 1-2 tablets by mouth every 6 (six) hours as needed for moderate pain or severe pain. 09/14/21  Yes Arby Barrette, MD  acyclovir (ZOVIRAX) 400 MG tablet Take 400 mg by mouth 3 (three) times daily. 10/19/19   [provider]  triamterene-hydrochlorothiazide (MAXZIDE-25) 37.5-25 MG tablet TAKE 1/2 TABLET BY MOUTH DAILY 09/04/21   Margaree Mackintosh, MD      Allergies    Patient has no known allergies.    Review of Systems   Review of Systems Constitutional: No fever no chills no malaise Musculoskeletal: No myalgias or arthralgias Respiratory: No cough no shortness of breath no chest pain. GI: No abdominal pain nausea vomiting or diarrhea Physical Exam Updated Vital Signs BP (!) 143/106 (BP Location: Right Arm)    Pulse 91    Temp 98.5 F (36.9 C) (Oral)    Resp 15    LMP  (LMP Unknown)    SpO2 99%  Physical Exam Constitutional:      Appearance: Normal appearance.  HENT:     Right Ear: Tympanic membrane normal.     Left Ear: Tympanic membrane normal.      Mouth/Throat:     Mouth: Mucous membranes are moist.     Pharynx: Oropharynx is clear.  Eyes:     Extraocular Movements: Extraocular movements intact.     Conjunctiva/sclera: Conjunctivae normal.  Cardiovascular:     Rate and Rhythm: Normal rate and regular rhythm.  Pulmonary:     Effort: Pulmonary effort is normal.     Breath sounds: Normal breath sounds.  Musculoskeletal:     Cervical back: Neck supple.  Lymphadenopathy:     Cervical: No cervical adenopathy.  Skin:    General: Skin is warm and dry.  Neurological:     General: No focal deficit present.     Mental Status: She is alert and oriented to person, place, and time.    ED Results / Procedures / Treatments   Labs (all labs ordered are listed, but only abnormal results are displayed) Labs Reviewed  RESP PANEL BY RT-PCR (FLU A&B, COVID) ARPGX2 - Abnormal; Notable for the following components:      Result Value   SARS Coronavirus 2 by RT PCR POSITIVE (*)    All other components within normal limits  GROUP A STREP BY PCR    EKG None  Radiology No results found.  Procedures Procedures    Medications Ordered in ED Medications - No data to display  ED Course/ Medical Decision  Making/ A&P                           Medical Decision Making      patient presents with pharyngitis.  No exudates swelling or signs of airway impingement.  Patient was recently on a cruise with potential exposure for infectious disease.  Rapid strep and COVID testing obtained.  COVID testing returns positive.  Patient is nontoxic.  Clinically well in appearance.  She does not have other associated symptoms.  Patient reports due to her family history of kidney dysfunction, she is very careful about taking any medications.  She always avoids NSAIDs and does not wish to take Paxil bid due to concerns for possible adverse effect.  Patient is aware that symptoms may worsen, however she is fully vaccinated and appears low risk for complicated  course of COVID.  Directions and prescriptions provided for symptomatic treatment with Vicodin for cough and severe sore throat and detailed instructions for use of acetaminophen over-the-counter for control of body aches and fever.     Final Clinical Impression(s) / ED Diagnoses Final diagnoses:  COVID  Pharyngitis due to other organism    Rx / DC Orders ED Discharge Orders          Ordered    HYDROcodone-acetaminophen (NORCO/VICODIN) 5-325 MG tablet  Every 6 hours PRN        09/14/21 1010              Arby Barrette, MD 09/14/21 1019

## 2022-09-09 ENCOUNTER — Telehealth (INDEPENDENT_AMBULATORY_CARE_PROVIDER_SITE_OTHER): Payer: Self-pay | Admitting: Internal Medicine

## 2022-09-09 ENCOUNTER — Telehealth: Payer: Self-pay | Admitting: Internal Medicine

## 2022-09-09 ENCOUNTER — Encounter: Payer: Self-pay | Admitting: Internal Medicine

## 2022-09-09 VITALS — Temp 97.4°F | Wt 158.0 lb

## 2022-09-09 DIAGNOSIS — B009 Herpesviral infection, unspecified: Secondary | ICD-10-CM

## 2022-09-09 MED ORDER — ACYCLOVIR 400 MG PO TABS: 400.0000 mg | ORAL_TABLET | Freq: Three times a day (TID) | ORAL | 2 refills | Status: AC

## 2022-09-09 NOTE — Progress Notes (Signed)
   Subjective:    Patient ID: Theresa Velasquez, female    DOB: July 19, 1976, 46 y.o.   MRN: [id number]  HPI 46 year old Female seen today via interactive audio and video telecommunications.  Patient is identified using 2 identifiers as Theresa Velasquez, a patient in this practice.  She is agreeable to visit in this format today.  She is at her home and I am at my office.  She has a history of essential hypertension treated with Maxide 25.  No known drug allergies.  No history of serious illnesses, accidents or operations.  History of cholecystitis and sprain of 2021.  Has not had cholecystectomy.  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  She does not smoke.  Social alcohol consumption.  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  She has had Zovirax on hand to take if she has such an outbreak but is out.  Patient reports that there is a lesion adjacent to her right lip that feels like an outbreak of HSV type I.    Review of Systems she has no fever, chills, nausea, vomiting, headache.  No drainage from the lesion.     Objective:   Physical Exam  Temperature reported is 97.4 degrees in the temporal area.  Weight 158 pounds.  She has a small lesion just to the right of her right upper lip that is not draining.  She feels it is similar to a previous lesion she has had before for which she took Zovirax.  She does not think this is a carbuncle or pimple.    Assessment & Plan:   History of HSV type I treated with Zovirax.  May have outbreak once again.  She would like to try Zovirax and I have sent in 400 mg 3 times daily for 5 days with 2 refills.  Suggested she may want to try warm compresses to the area for 15 to 20 minutes a couple of times a day.

## 2022-09-09 NOTE — Patient Instructions (Signed)
It was a pleasure to speak with you today.  We are sorry you are not feeling well.  I have sent in Zovirax 400 mg 3 times daily for 5 days with 2 refills.  May want to try warm compresses to the area on your face for 15 to 20 minutes a couple of times a day.  Please call if not improving in 48 hours or sooner if worse.

## 2022-09-09 NOTE — Telephone Encounter (Signed)
Theresa Velasquez [phone]  Theresa called to say she has a cold sore and would like to get a shot or medicine. Schedule video visit.

## 2023-05-06 ENCOUNTER — Other Ambulatory Visit: Payer: Self-pay | Admitting: Internal Medicine
